# Patient Record
Sex: Male | Born: 2000 | Hispanic: Yes | Marital: Single
Health system: Midwestern US, Community
[De-identification: ages and names within clinical notes are randomized; demographics above are authoritative.]

## PROBLEM LIST (undated history)

## (undated) DIAGNOSIS — J45909 Unspecified asthma, uncomplicated: Secondary | ICD-10-CM

## (undated) DIAGNOSIS — K469 Unspecified abdominal hernia without obstruction or gangrene: Secondary | ICD-10-CM

## (undated) DIAGNOSIS — L0591 Pilonidal cyst without abscess: Secondary | ICD-10-CM

## (undated) DIAGNOSIS — M2550 Pain in unspecified joint: Secondary | ICD-10-CM

## (undated) DIAGNOSIS — M25561 Pain in right knee: Principal | ICD-10-CM

## (undated) HISTORY — DX: Unspecified asthma, uncomplicated: J45.909

## (undated) HISTORY — DX: Unspecified abdominal hernia without obstruction or gangrene: K46.9

---
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
## 2022-11-06 ENCOUNTER — Ambulatory Visit
Admit: 2022-11-06 | Discharge: 2022-11-06 | Payer: PRIVATE HEALTH INSURANCE | Attending: Family Medicine | Primary: Family Medicine

## 2022-11-06 DIAGNOSIS — M545 Low back pain, unspecified: Secondary | ICD-10-CM

## 2022-11-06 NOTE — Progress Notes (Signed)
Subjective  Chief Complaint   Patient presents with    New Patient     Establish care, has been having aches and pain lately.      HPI:  Derek Dudley is a 22 y.o. male.    He presents with a few musculoskeletal complaints.    Right wrist-reports that he couple weeks of increased pain based on position.  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    Left shoulder pain-reports uncomfortable clicking based on certain movements.  This began after a dislocation while swimming a bat during a baseball game.  The dislocation was quick and corrected itself.    Lower back pain-reports many years of lower back pain, exacerbated by bending over.  Patient has active lifestyle and this gets his way at times.  Is a physical therapist friend who suggested doing strengthening exercises such as [place] dead lifts.  He reports he has been doing this for couple months with a little bit of improvement.    Past Medical History:   Diagnosis Date    ADHD (attention deficit hyperactivity disorder)     Chronic back pain      No current outpatient medications on file prior to visit.     No current facility-administered medications on file prior to visit.     No Known Allergies    Objective  Vitals:    11/06/22 1108   BP: 128/65   Pulse: 56   Resp: 18   Temp: 98.1 F (36.7 C)     Wt Readings from Last 3 Encounters:   11/06/22 78.5 kg (173 lb)     Physical Exam  Constitutional:       Appearance: Normal appearance.   HENT:      Head: Normocephalic and atraumatic.   Cardiovascular:      Rate and Rhythm: Normal rate and regular rhythm.   Pulmonary:      Effort: Pulmonary effort is normal.      Breath sounds: Normal breath sounds.   Musculoskeletal:        Arms:       Comments: Left wrist-pain with gripping on ulnar side of wrist.  lower back pain-midline-mild discomfort with palpitation.  Pain reproduced with bending to toes.     Neurological:      Mental Status:  He is alert.          Assessment & Plan  1. Chronic midline low back pain without sciatica  Assessment & Plan:   X-ray done by Ortho in 2023 did not demonstrate any fracture or other abnormality.  Likely muscular or tendon/ligament based.  Patient has active lifestyle and uses his back a lot.  He has started dead lifting to increase his strength and reports a little improvement over the last 2 months.  Advised him to continue.  If he is worsening or not improving, we could obtain MRI to look for soft tissue damage.  2. Chronic left shoulder pain  Assessment & Plan:   Secondary to dislocation in the distant past.  Possibly shoulder instability due to stretch tendons or damaged labrum.  Discussed continuing light exercises.  If symptoms worsening can proceed with x-ray followed by MRI.  3. Strain of right wrist, initial encounter  Comments:  Likely due to weightlifting as well as starting new job as a Scientist, forensic.  Discussed strategies to rest wrist with lifting straps  34 minutes total spent on visit.    Aspects of this note have been generated using voice recognition software. Despite editing, there may be some syntax errors  Follow-up and Dispositions    Return in about 6 weeks (around 12/18/2022).       Roma Schanz, MD

## 2022-11-06 NOTE — Progress Notes (Signed)
"  Have you been to the ER, urgent care clinic since your last visit?  Hospitalized since your last visit?"    NO    "Have you seen or consulted any other health care providers outside of [facility] since your last visit?"    NO            Click Here for Release of Records Request

## 2022-11-06 NOTE — Assessment & Plan Note (Signed)
Secondary to dislocation in the distant past.  Possibly shoulder instability due to stretch tendons or damaged labrum.  Discussed continuing light exercises.  If symptoms worsening can proceed with x-ray followed by MRI.

## 2022-11-06 NOTE — Assessment & Plan Note (Signed)
X-ray done by Ortho in 2023 did not demonstrate any fracture or other abnormality.  Likely muscular or tendon/ligament based.  Patient has active lifestyle and uses his back a lot.  He has started dead lifting to increase his strength and reports a little improvement over the last 2 months.  Advised him to continue.  If he is worsening or not improving, we could obtain MRI to look for soft tissue damage.

## 2022-12-18 ENCOUNTER — Ambulatory Visit
Admit: 2022-12-18 | Discharge: 2022-12-18 | Payer: PRIVATE HEALTH INSURANCE | Attending: Family Medicine | Primary: Family Medicine

## 2022-12-18 VITALS — BP 126/73 | HR 51 | Temp 98.30000°F | Ht 68.0 in | Wt 170.8 lb

## 2022-12-18 DIAGNOSIS — M25512 Pain in left shoulder: Secondary | ICD-10-CM

## 2022-12-18 NOTE — Progress Notes (Signed)
 Subjective  Chief Complaint   Patient presents with    Other     Follow  up health concerns     HPI:  Derek Dudley is a 22 y.o. male.    Patient presents for follow-up for multiple musculoskeletal complaints.    Left foot-reports right MTP has been hurting him over the last several weeks.  He has had some improvement with switching to great arch support, but he is not consistent with wearing the shoes.    Left hip-reports pain with external rotation while flexed.  Symptoms have been constant over the last 2 months with no improvement.    Left shoulder pain-patient has not had much improvement with exercising on his own, and reports left shoulder is weaker and easier to fatigue.    Right shoulder pain-for the last 2 weeks.  Patient reports it feels similar to tendinopathy in the past.  Symptoms have only worsened when he is try to work out on his own.    Past Medical History:   Diagnosis Date    ADHD (attention deficit hyperactivity disorder)     Chronic back pain      No current outpatient medications on file prior to visit.     No current facility-administered medications on file prior to visit.     No Known Allergies    Objective  Vitals:    12/18/22 1053   BP: 126/73   Pulse: 51   Temp: 98.3 F (36.8 C)     Wt Readings from Last 3 Encounters:   12/18/22 77.5 kg (170 lb 12.8 oz)   11/06/22 78.5 kg (173 lb)     Physical Exam  Constitutional:       Appearance: Normal appearance.   HENT:      Head: Normocephalic and atraumatic.   Cardiovascular:      Rate and Rhythm: Normal rate and regular rhythm.   Pulmonary:      Effort: Pulmonary effort is normal.      Breath sounds: Normal breath sounds.   Neurological:      General: No focal deficit present.      Mental Status: He is alert and oriented to person, place, and time.   Psychiatric:         Mood and Affect: Mood normal.         Behavior: Behavior normal.          Assessment & Plan  1. Chronic left shoulder pain  Assessment & Plan:   Secondary to dislocation in the  distant past. Possibly shoulder instability due to stretch tendons or damaged labrum.  Symptoms not improving much with exercises on his own.  Will refer to PT for further education and treatment  2. Left hip pain  Assessment & Plan:   Possible strain versus sprain versus hip joint inflammation. Will start with PT to see if he can be treated with strength and flexibility training   3. Acute pain of right shoulder  Assessment & Plan:   Appears to be tendinitis. Will refer to PT to discuss how to restrengthen the shoulder without aggravating   4. Left foot pain  Comments:  Tender to right first MTP.  Will treat with Voltaren .  Advised to continue wearing better arch support    30 minutes spent on encounter.    Aspects of this note have been generated using voice recognition software. Despite editing, there may be some syntax errors  Follow-up and Dispositions    Return in about  1 year (around 12/18/2023).       Lonni Pinal, MD

## 2022-12-18 NOTE — Progress Notes (Signed)
 Derek Dudley (DOB:  2000-08-16) is a 22 y.o. male,Established patient, here for evaluation of the following chief complaint(s):  Other (Follow  up health concerns)         Assessment & Plan    R wrist- pt stated that his right wrist was better and no further workup was needed    L shoulder- crepitus was noted upon abduction; full can test and drop arm test were both negative bilaterally; physical therapy referral was noted; pt was notified to take precaution in exercising and working with shoulder    Pain and tightness on R base of foot- pt notified to use voltaren  as needed and using different shoes    Pinched nerve extending from left cervical spine to left mid thoracic area- pt was referred for physical therapy    No follow-ups on file.       Subjective   HPI    R wrist- pt admits that it has been getting better    L shoulder- pt admits to having crepitus whenever he moves his shoulder; pt denies pain    Pain/tightness on R base of foot- pt doesn't remember the onset but has been getting worse for the past few months; pt denies the pain radiating anywhere; pt denies tingling/numbness; pt admits to wearing tight shoes; pt's pain is centered around great toe    Pinched nerve in the back- pt stated that his left side feels numb and tingling towards the thoracic area; pt does not remember a triggering event    Review of Systems       Objective   Physical Exam  Vitals reviewed.   Constitutional:       Appearance: Normal appearance.   Musculoskeletal:         General: Tenderness present. Normal range of motion.   Neurological:      General: No focal deficit present.      Mental Status: He is alert and oriented to person, place, and time.                  An electronic signature was used to authenticate this note.    --Annabella Donalds

## 2022-12-18 NOTE — Assessment & Plan Note (Signed)
 Secondary to dislocation in the distant past. Possibly shoulder instability due to stretch tendons or damaged labrum.  Symptoms not improving much with exercises on his own.  Will refer to PT for further education and treatment

## 2022-12-18 NOTE — Assessment & Plan Note (Signed)
 Possible strain versus sprain versus hip joint inflammation. Will start with PT to see if he can be treated with strength and flexibility training

## 2022-12-18 NOTE — Progress Notes (Signed)
"  Have you been to the ER, urgent care clinic since your last visit?  Hospitalized since your last visit?"    NO    "Have you seen or consulted any other health care providers outside of [facility] since your last visit?"    NO

## 2022-12-18 NOTE — Assessment & Plan Note (Signed)
 Appears to be tendinitis. Will refer to PT to discuss how to restrengthen the shoulder without aggravating

## 2023-01-05 ENCOUNTER — Inpatient Hospital Stay: Admit: 2023-01-05 | Payer: PRIVATE HEALTH INSURANCE | Attending: Family Medicine | Primary: Family Medicine

## 2023-01-05 DIAGNOSIS — M25512 Pain in left shoulder: Secondary | ICD-10-CM

## 2023-01-05 NOTE — Therapy Evaluation (Signed)
 Physical Therapy at [facility],   a part of [facility]  [address] [place], [address]  [place], [place]  [address]  Phone: [phone]  Fax: [phone]       PHYSICAL THERAPY - EVALUATION/PLAN OF CARE NOTE (updated 3/23)      Date: 01/05/2023          Patient Name:  Derek Dudley DOB:  Sep 02, 2000   Medical   Diagnosis:  Chronic left shoulder pain [M25.512, G89.29]  Left hip pain [M25.552]  Acute pain of right shoulder [M25.511]  Left foot pain [M79.672] Treatment Diagnosis:  M25.552  LEFT HIP PAIN  and M25.511  RIGHT SHOULDER PAIN    Referral Source:  Cleotilde Bruckner, MD Provider #:  [id number]                Insurance: Payor: [facility] / Plan: [facility] / Product Type: *No Product type* /      Patient DOB verified yes     Visit #   Current  / Total 1 8   Time   In / Out 1230p 115p   Total Treatment Time 45   Total Timed Codes 0         SUBJECTIVE  Pain Level (0-10 scale): 2  [] constant [x] intermittent [] improving [] worsening [] no change since onset    Any medication changes, allergies to medications, adverse drug reactions, diagnosis change, or new procedure performed?: [x]  No    []  Yes (see summary sheet for update)  Medications: Verified on Patient Summary List    Subjective functional status/changes:     Pt reports his left hip started bothering him 3 months ago with squatting.  He notes his right shoulder has been painful a year and a half.  It went away for a couple months and he got a more physically demanding job at [employer] where he is moving people around a bit.  He reports lifting his arm in front of him and reaching overhead make his pain worse.  He notes straight on chest press is fine, but overhead presses hurt.  He notes biofreeze helps, but rest is most helpful.  He notes hip pain increased with more depth during a squat or getting up/down off the floor.    Start of Care: 01/05/2023  Onset Date: 09/19/2021  Current  symptoms/Complaints: Pt reports R shoulder tightness/ache and L hip anterior pinch/pain with hip flexion.  Mechanism of Injury: overuse  PLOF: Independent  Limitations to PLOF/Activity or Recreational Limitations: pain at work, unable to exercise at gym without pain  Work Hx: started new job at [employer] Situation: lives at home with parents  Mobility: Independent  Self Care: Independnent  Previous Treatment/Compliance: poor, [facility] that he did not enjoy  PMHx/Surgical Hx/Comorbidites: R ACL reconstructive surgery  Prior Hospitalization:  none reported  Barriers: [x] pain [] Financial [x] time [] transportation [] Other:  Substance use: [] Alcohol [] Tobacco [] other:   Pt Goals: pain free return to work and exercise  Motivation: High  Cognition: A & O x 4           OBJECTIVE  Posture:  WNL  Palpation: TTP @ R long head of bicep and supraspinatus tendon, L hip flexor    Shoulder ROM:   AROM   PROM   Flexion   WNL   WNL   Abduction  WNL   WNL   Scaption  WNL   WNL  Apley - IR/ADD WNL   WNL   Apley - ER/ABD WNL   WNL  Hip ROM:   Flexion   75% pain at end range on L    Joint Mobility Assessment: Glenohumeral: 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      Scapulohumeral: WNL bilaterally      Sternoclavicular: WNL bilaterally    Flexibility: limited hamstring length bilaterally    UPPER QUARTER   MUSCLE STRENGTH  KEY       R  L  0 - No Contraction   Flexion  4  4  1  - Trace    Extension 4  5  2  - Poor    Abduction 3+  4  3 - Fair     IR  4  5  4  - Good    ER  4  4+  5 - Normal       Neurological: Reflexes / Sensations: WNL bilaterally  Special Tests: Painful Arc Sign: + on R     Scapular Reposition: + on R    Bicep's Load II: - bilaterally   Speed's: + on R     Objective/Functional Outcome Measure: DASH  FOTO Score: 50  FOTO score = an established functional score where 100 = no disability      45 min [x] Eval - untimed                            [x]   Patient Education billed concurrently with other procedures   [x]   Review HEP    []  Progressed/Changed HEP, detail:    [x]  Other detail: discussion of modifying exercise routine    Pain Level at end of session (0-10 scale): 1    Plan of Care / Statement of Necessity for Physical Therapy Services     Assessment / key information:     Mr. Terlizzi is referred to skilled therapy services for treatment of right shoulder and left hip pain.  He notes pain off/on with exercise, but continued increase in symptoms recently with new job at [employer].  He reports difficulty with sleeping and exercise as well as pain during physical work duties such as pushing and pulling.  He will benefit from skilled therapy services to reduce his pain with return to ADL, work and exercise.    Evaluation Complexity:  History:  LOW Complexity : Zero comorbidities / personal factors that will impact the outcome / POC; Examination:  LOW Complexity : 1-2 Standardized tests and measures addressing body structure, function, activity limitation and / or participation in recreation  ;Presentation:  LOW Complexity : Stable, uncomplicated  ;Clinical Decision Making:  MEDIUM Complexity : FOTO score of 26-74 Overall Complexity Rating: LOW   Problem List: pain affecting function, decrease ROM, decrease strength, decrease ADL/functional abilities, decrease activity tolerance, and decrease flexibility/joint mobility   Treatment Plan may include any combination of the following: 02889 Therapeutic Exercise, 97112 Neuromuscular Re-Education, 97140 Manual Therapy, 97530 Therapeutic Activity, 97535 Self Care/Home Management, and (Elective Self Pay) Needle Insertion w/o Injection (1 or 2 muscles), (3+ muscles)  Patient / Family readiness to learn indicated by: asking questions, trying to perform skills, interest, return verbalization , and return demonstration   Persons(s) to be included in education: patient (P)  Barriers to Learning/Limitations: none  Measures taken if barriers to learning present: none  Patient Self Reported  Health Status: excellent  Rehabilitation Potential: excellent    Short Term Goals: To be accomplished in  4 treatments.  The patient will be independent with introductory HEP with no v.c. or tactile cuing by PT needed   The patient will improve pain-free shoulder flexion AROM to 180 deg to improve ease with overhead reaching tasks at work.  The patient will demonstrate ability to squat to/from below chair height and get up/down off the floor as required by work with < 2/10 hip pain.  Long Term Goals: To be accomplished in 8 treatments.  The patient will demonstrate pain free shoulder AROM multidirectionally to improve ease with overhead work tasks.  The patient will report ability to unload groceries from car into home without an increase in pain within the last 5 days  The patient will demonstrate a minimum of 5/5 strength of shoulder abduction and flexion to allow for UE endurance for fulling loading and transferring clothing from washer and dryer without rest breaks and without an increase in pain.  The patient will demonstrate ability to lift 2 pounds overhead with pain level of 2/10 or less to improve ease of putting away dishes in kitchen and work activities.    Frequency / Duration: Patient to be seen 1 times per week for 8 treatments.    Patient/ Caregiver education and instruction: Diagnosis, prognosis, self care, activity modification, and exercises   [x]   Plan of care has been reviewed with PTA          Prentice Sous, PT , DPT, Cert. DN, CRGA Level II       01/05/2023       12:29 PM        ===================================================================  I certify that the above Therapy Services are being furnished while the patient is under my care. I agree with the treatment plan and certify that this therapy is necessary.    Physician's Signature:_________________________   DATE:_________   TIME:________                           Cleotilde Bruckner, MD    ** Signature, Date and Time must be completed  for valid certification **  Please sign and fax to [phone].  Thank you

## 2023-01-12 ENCOUNTER — Encounter: Payer: PRIVATE HEALTH INSURANCE | Primary: Family Medicine

## 2023-01-19 ENCOUNTER — Inpatient Hospital Stay: Admit: 2023-01-19 | Payer: PRIVATE HEALTH INSURANCE | Primary: Family Medicine

## 2023-01-19 NOTE — Progress Notes (Signed)
 PHYSICAL THERAPY - DAILY TREATMENT NOTE (updated 3/23)      Date: 01/19/2023          Patient Name:  Derek Dudley DOB:  [birth year]   Medical   Diagnosis:  Chronic left shoulder pain [M25.512, G89.29]  Left hip pain [M25.552]  Acute pain of right shoulder [M25.511]  Left foot pain [M79.672] Treatment Diagnosis:  M25.552  LEFT HIP PAIN  and M25.511  RIGHT SHOULDER PAIN    Referral Source:  Cleotilde Bruckner, MD Insurance:   Payor: [facility] / Plan: [facility] / Product Type: *No Product type* /                     Patient DOB verified yes     Visit #   Current  / Total 2 8   Time   In / Out 1200p 1225p   Total Treatment Time 25   Total Timed Codes 25         SUBJECTIVE    Pain Level (0-10 scale): 2    Any medication changes, allergies to medications, adverse drug reactions, diagnosis change, or new procedure performed?: [x]  No    []  Yes (see summary sheet for update)  Medications: Verified on Patient Summary List    Subjective functional status/changes:       Pt reports the exercises are helping.  He has limited his squat depth and used the hip flexor mobility with success.  He notes a little tightness and pain in the shoulder, but he is doing his home exercises every other day.    OBJECTIVE      Therapeutic Procedures:  Tx Min Billable or 1:1 Min (if diff from Tx Min) Procedure, Rationale, Specifics   25  97110 Therapeutic Exercise (timed):  increase ROM, strength, coordination, balance, and proprioception to improve patient's ability to progress to PLOF and address remaining functional goals. (see flow sheet as applicable)     Details if applicable:           Details if applicable:     25 25    Total Total       [x]   Patient Education billed concurrently with other procedures   [x]  Review HEP    []  Progressed/Changed HEP, detail:    []  Other detail:         Other Objective/Functional Measures  Squat depth below 90 degrees with BW without pain.  Pain noted with addition of 20 lbs on  this date.    Pain Level at end of session (0-10 scale): 1      Assessment     Patient tolerated progression of squat depth and scapular stability exercises well on this date.  He reports performing them every other day at home and is surprised by how difficulty they can be.  He is motivated to continue as he sees improving symptoms with his daily activities.    Patient will continue to benefit from skilled PT / OT services to modify and progress therapeutic interventions, analyze and address functional mobility deficits, analyze and address ROM deficits, analyze and address strength deficits, analyze and address soft tissue restrictions, and analyze and cue for proper movement patterns to address functional deficits and attain remaining goals.    Progress toward goals / Updated goals:  []   See Progress Note/Recertification    Short Term Goals: To be accomplished in 4 treatments.  The patient will be independent with introductory HEP with no  v.c. or tactile cuing by PT needed   The patient will improve pain-free shoulder flexion AROM to 180 deg to improve ease with overhead reaching tasks at work.  The patient will demonstrate ability to squat to/from below chair height and get up/down off the floor as required by work with < 2/10 hip pain.  Long Term Goals: To be accomplished in 8 treatments.  The patient will demonstrate pain free shoulder AROM multidirectionally to improve ease with overhead work tasks.  The patient will report ability to unload groceries from car into home without an increase in pain within the last 5 days  The patient will demonstrate a minimum of 5/5 strength of shoulder abduction and flexion to allow for UE endurance for fulling loading and transferring clothing from washer and dryer without rest breaks and without an increase in pain.  The patient will demonstrate ability to lift 2 pounds overhead with pain level of 2/10 or less to improve ease of putting away dishes in kitchen and work  activities.      PLAN  Yes  Continue plan of care  [x]   Upgrade activities as tolerated  []   Discharge due to:  []   Other:      Prentice Sous, PT , DPT, Cert. DN, CRGA Level II       01/19/2023       12:04 PM

## 2023-01-26 ENCOUNTER — Inpatient Hospital Stay: Admit: 2023-01-26 | Payer: PRIVATE HEALTH INSURANCE | Primary: Family Medicine

## 2023-01-26 DIAGNOSIS — M25512 Pain in left shoulder: Secondary | ICD-10-CM

## 2023-01-26 NOTE — Progress Notes (Signed)
PHYSICAL THERAPY - DAILY TREATMENT NOTE (updated 3/23)      Date: 01/26/2023          Patient Name:  Derek Dudley DOB:  Mar 29, 2001   Medical   Diagnosis:  Chronic left shoulder pain [M25.512, G89.29]  Left hip pain [M25.552]  Acute pain of right shoulder [M25.511]  Left foot pain [M79.672] Treatment Diagnosis:  M25.552  LEFT HIP PAIN  and M25.511  RIGHT SHOULDER PAIN    Referral Source:  Roma Schanz, MD Insurance:   Payor: [employer] [facility] / Plan: [facility] / Product Type: *No Product type* /                     Patient DOB verified yes     Visit #   Current  / Total 3 8   Time   In / Out 1150a 1230p   Total Treatment Time 40   Total Timed Codes 40         SUBJECTIVE    Pain Level (0-10 scale): 1    Any medication changes, allergies to medications, adverse drug reactions, diagnosis change, or new procedure performed?: [x]  No    []  Yes (see summary sheet for update)  Medications: Verified on Patient Summary List    Subjective functional status/changes:       Pt reports the hips and shoulder are feeling better with exercises.  He did box squats after PT last time and was able to increase with weight without pain.    OBJECTIVE      Therapeutic Procedures:  Tx Min Billable or 1:1 Min (if diff from Tx Min) Procedure, Rationale, Specifics   25  97110 Therapeutic Exercise (timed):  increase ROM, strength, coordination, balance, and proprioception to improve patient's ability to progress to PLOF and address remaining functional goals. (see flow sheet as applicable)     Details if applicable:     15  97112 Neuromuscular Re-Education (timed):  improve balance, coordination, kinesthetic sense, posture, core stability and proprioception to improve patient's ability to develop conscious control of individual muscles and awareness of position of extremities in order to progress to PLOF and address remaining functional goals. (see flow sheet as applicable)    Details if applicable:     25 25     Total Total       [x]   Patient Education billed concurrently with other procedures   [x]  Review HEP    []  Progressed/Changed HEP, detail:    []  Other detail:         Other Objective/Functional Measures  Squat depth below 90 degrees with 20lbs without pain.  Pain noted with increase in squat depth.    Shoulder pain reported after overhead carry with 10 lb kettlebell bottoms up    Pain Level at end of session (0-10 scale): 1      Assessment     Patient tolerated progression of squat depth and scapular stability exercises well on this date.  He reports performing them every other day at home.  He was given more hip mobility exercises to focus on at home as he needs deeper squat for work activities and is unable to pain free currently.    Patient will continue to benefit from skilled PT / OT services to modify and progress therapeutic interventions, analyze and address functional mobility deficits, analyze and address ROM deficits, analyze and address strength deficits, analyze and address soft tissue restrictions, and analyze and cue  for proper movement patterns to address functional deficits and attain remaining goals.    Progress toward goals / Updated goals:  []   See Progress Note/Recertification    Short Term Goals: To be accomplished in 4 treatments.  The patient will be independent with introductory HEP with no v.c. or tactile cuing by PT needed   The patient will improve pain-free shoulder flexion AROM to 180 deg to improve ease with overhead reaching tasks at work.  The patient will demonstrate ability to squat to/from below chair height and get up/down off the floor as required by work with < 2/10 hip pain.  Long Term Goals: To be accomplished in 8 treatments.  The patient will demonstrate pain free shoulder AROM multidirectionally to improve ease with overhead work tasks.  The patient will report ability to unload groceries from car into home without an increase in pain within the last 5 days  The patient  will demonstrate a minimum of 5/5 strength of shoulder abduction and flexion to allow for UE endurance for fulling loading and transferring clothing from washer and dryer without rest breaks and without an increase in pain.  The patient will demonstrate ability to lift 2 pounds overhead with pain level of 2/10 or less to improve ease of putting away dishes in kitchen and work activities.      PLAN  Yes  Continue plan of care  [x]   Upgrade activities as tolerated  []   Discharge due to:  []   Other:      Dyann Ruddle, PT , DPT, Cert. DN, CRGA Level II       01/26/2023       11:52 AM

## 2023-02-02 ENCOUNTER — Inpatient Hospital Stay: Admit: 2023-02-02 | Payer: PRIVATE HEALTH INSURANCE | Primary: Family Medicine

## 2023-02-02 NOTE — Progress Notes (Signed)
PHYSICAL THERAPY - DAILY TREATMENT NOTE (updated 3/23)      Date: 02/02/2023          Patient Name:  Derek Dudley DOB:  2001/04/06   Medical   Diagnosis:  Chronic left shoulder pain [M25.512, G89.29]  Left hip pain [M25.552]  Acute pain of right shoulder [M25.511]  Left foot pain [M79.672] Treatment Diagnosis:  M25.552  LEFT HIP PAIN  and M25.511  RIGHT SHOULDER PAIN    Referral Source:  Roma Schanz, MD Insurance:   Payor: [employer] [facility] [facility] [facility] / Plan: [facility] / Product Type: *No Product type* /                     Patient DOB verified yes     Visit #   Current  / Total 3 8   Time   In / Out 1150a 1230p   Total Treatment Time 40   Total Timed Codes 40         SUBJECTIVE    Pain Level (0-10 scale): 2    Any medication changes, allergies to medications, adverse drug reactions, diagnosis change, or new procedure performed?: [x]  No    []  Yes (see summary sheet for update)  Medications: Verified on Patient Summary List    Subjective functional status/changes:       Pt reports the last 2 weeks the inside of his R hamstring up to his glute has been bothering him.  He notes the heel started to bother him recently too.  He reports the left hip and right shoulder are feeling better.    OBJECTIVE      Therapeutic Procedures:  Tx Min Billable or 1:1 Min (if diff from Tx Min) Procedure, Rationale, Specifics   25  97110 Therapeutic Exercise (timed):  increase ROM, strength, coordination, balance, and proprioception to improve patient's ability to progress to PLOF and address remaining functional goals. (see flow sheet as applicable)     Details if applicable:     15  97112 Neuromuscular Re-Education (timed):  improve balance, coordination, kinesthetic sense, posture, core stability and proprioception to improve patient's ability to develop conscious control of individual muscles and awareness of position of extremities in order to progress to PLOF and address remaining functional goals. (see flow  sheet as applicable)    Details if applicable:     25 25    Total Total       [x]   Patient Education billed concurrently with other procedures   [x]  Review HEP    []  Progressed/Changed HEP, detail:    []  Other detail:         Other Objective/Functional Measures  Squat depth below 90 degrees with 40lbs with L hamstring pain.  Pain noted with increase in squat depth.    R medial distal hamstring pain with palpation and resisted knee flexion    Shoulder pain reported after overhead carry with 15 lb kettlebell bottoms up    Pain Level at end of session (0-10 scale): 1      Assessment     Patient tolerated introduction to hamstring specific exercises on this date with emphasis on mobility routine.  He notes less pain at depth of squat with sit to stand and no pain in his shoulder with return to upper body exercises at gym that are not overhead.  He will continue to benefit from exercise progression as tolerated with education on avoiding running for now while his hamstring and  hip recover.    Patient will continue to benefit from skilled PT / OT services to modify and progress therapeutic interventions, analyze and address functional mobility deficits, analyze and address ROM deficits, analyze and address strength deficits, analyze and address soft tissue restrictions, and analyze and cue for proper movement patterns to address functional deficits and attain remaining goals.    Progress toward goals / Updated goals:  []   See Progress Note/Recertification    Short Term Goals: To be accomplished in 4 treatments.  The patient will be independent with introductory HEP with no v.c. or tactile cuing by PT needed   The patient will improve pain-free shoulder flexion AROM to 180 deg to improve ease with overhead reaching tasks at work.  The patient will demonstrate ability to squat to/from below chair height and get up/down off the floor as required by work with < 2/10 hip pain.  Long Term Goals: To be accomplished in 8  treatments.  The patient will demonstrate pain free shoulder AROM multidirectionally to improve ease with overhead work tasks.  The patient will report ability to unload groceries from car into home without an increase in pain within the last 5 days  The patient will demonstrate a minimum of 5/5 strength of shoulder abduction and flexion to allow for UE endurance for fulling loading and transferring clothing from washer and dryer without rest breaks and without an increase in pain.  The patient will demonstrate ability to lift 2 pounds overhead with pain level of 2/10 or less to improve ease of putting away dishes in kitchen and work activities.      PLAN  Yes  Continue plan of care  [x]   Upgrade activities as tolerated  []   Discharge due to:  []   Other:      Dyann Ruddle, PT , DPT, Cert. DN, CRGA Level II       02/02/2023       11:51 AM

## 2023-02-02 NOTE — Discharge Instructions (Signed)
Physical Therapy at [facility],   a part of [facility]  [address] [place], [address]  [place], [place] [address]  Phone: [phone]  Fax: [phone]  DISCHARGE SUMMARY  Patient Name: Derek Dudley DOB: March 17, 2001   Treatment/Medical Diagnosis: Chronic left shoulder pain [M25.512, G89.29]  Left hip pain [M25.552]  Acute pain of right shoulder [M25.511]  Left foot pain [M79.672]   Referral Source: Roma Schanz, MD     Date of Initial Visit: 01/05/2023 Attended Visits: 4 Missed Visits: 1     SUMMARY OF TREATMENT  [name] cancelled remaining PT visits as he reports he no longer has hip or shoulder pain with daily activities.  He met all goals (per pt report) except for 1 that was not able to be assessed as he decided to forego his last PT visit.  He reports independence with home exercises and no further pain with return to gym and work activities.    CURRENT STATUS    Short Term Goals: To be accomplished in 4 treatments.  The patient will be independent with introductory HEP with no v.c. or tactile cuing by PT needed . MET  The patient will improve pain-free shoulder flexion AROM to 180 deg to improve ease with overhead reaching tasks at work. MET  The patient will demonstrate ability to squat to/from below chair height and get up/down off the floor as required by work with < 2/10 hip pain. MET  Long Term Goals: To be accomplished in 8 treatments.  The patient will demonstrate pain free shoulder AROM multidirectionally to improve ease with overhead work tasks. MET  The patient will report ability to unload groceries from car into home without an increase in pain within the last 5 days. MET  The patient will demonstrate a minimum of 5/5 strength of shoulder abduction and flexion to allow for UE endurance for fulling loading and transferring clothing from washer and dryer without rest breaks and without an increase in pain. NOT ASSESSED  The patient will demonstrate  ability to lift 2 pounds overhead with pain level of 2/10 or less to improve ease of putting away dishes in kitchen and work activities. MET      RECOMMENDATIONS  Discontinue therapy. Progressing towards or have reached established goals.        Dyann Ruddle, PT , DPT, Cert. DN, CRGA Level II        02/23/2023       2:01 PM    If you have any questions/comments please contact us directly at [phone].   Thank you for allowing [place] to assist in the care of your patient.

## 2023-02-23 ENCOUNTER — Encounter: Payer: PRIVATE HEALTH INSURANCE | Primary: Family Medicine

## 2023-03-13 ENCOUNTER — Encounter: Payer: PRIVATE HEALTH INSURANCE | Primary: Family Medicine

## 2023-03-14 ENCOUNTER — Inpatient Hospital Stay
Admit: 2023-03-14 | Discharge: 2023-03-14 | Disposition: A | Discharge status: Home / Self Care | Payer: PRIVATE HEALTH INSURANCE | Attending: Emergency Medicine

## 2023-03-14 ENCOUNTER — Ambulatory Visit: Admit: 2023-03-14 | Payer: PRIVATE HEALTH INSURANCE | Primary: Family Medicine

## 2023-03-14 DIAGNOSIS — L0501 Pilonidal cyst with abscess: Secondary | ICD-10-CM

## 2023-03-14 MED ORDER — IBUPROFEN 600 MG PO TABS
600 | ORAL | Status: AC
Start: 2023-03-14 — End: 2023-03-14
  Administered 2023-03-14: 19:00:00 600 mg via ORAL

## 2023-03-14 MED ORDER — LIDOCAINE-EPINEPHRINE (PF) 1.5 %-1:200000 IJ SOLN
1.5 | Freq: Once | INTRAMUSCULAR | Status: AC
Start: 2023-03-14 — End: 2023-03-14
  Administered 2023-03-14: 22:00:00 5 mL via INTRADERMAL

## 2023-03-14 MED ORDER — OXYCODONE HCL 5 MG PO TABS
5 | ORAL | Status: AC
Start: 2023-03-14 — End: 2023-03-14
  Administered 2023-03-14: 21:00:00 5 mg via ORAL

## 2023-03-14 MED FILL — OXYCODONE HCL 5 MG PO TABS: 5 MG | ORAL | Qty: 1 | Fill #0

## 2023-03-14 MED FILL — IBUPROFEN 600 MG PO TABS: 600 MG | ORAL | Qty: 1 | Fill #0

## 2023-03-14 MED FILL — LIDOCAINE-EPINEPHRINE (PF) 1.5 %-1:200000 IJ SOLN: 1.5 %-1:200000 | INTRAMUSCULAR | Qty: 30 | Fill #0

## 2023-03-14 NOTE — Discharge Instructions (Addendum)
 Today you were evaluated for an abscess. Please keep the area clean and dry. You may soak 1-2x a day in an epsom bath to encourage continued drainage. You may alternate tylenol and motrin and place an ice pack to the area for pain. Please follow up with th

## 2023-03-14 NOTE — ED Triage Notes (Signed)
 Pt arrives to the ED with a tailbone cyst, pt reports he noticed it on Sunday and said there was some clear odorous drainage coming from it with a tinge of blood, pt reports its been getting more painful throughout the week. Denies fevers, reports some nau

## 2023-03-14 NOTE — ED Provider Notes (Signed)
 [facility] EMERGENCY DEPT  EMERGENCY DEPARTMENT ENCOUNTER      Pt Name: Derek Dudley  MRN: [id number]  Birthdate [birth year]  Date of evaluation: 03/14/2023  Provider: Debby Freiberg, PA-C    CHIEF COMPLAINT     No chief complaint on file.        HISTORY OF

## 2023-03-25 ENCOUNTER — Ambulatory Visit: Admit: 2023-03-25 | Payer: PRIVATE HEALTH INSURANCE | Admitting: Surgery | Primary: Family Medicine

## 2023-03-25 VITALS — BP 125/82 | HR 73 | Temp 97.90000°F | Resp 16 | Ht 66.0 in | Wt 184.2 lb

## 2023-03-25 DIAGNOSIS — L0501 Pilonidal cyst with abscess: Principal | ICD-10-CM

## 2023-03-25 NOTE — Progress Notes (Signed)
 Surgery Progress Note    03/25/2023    had concerns including New Patient, Abscess (Pilonidal), and ED Follow-up.     Subjective:     Patient is a 22 y.o. male who reports that last month developed an abscess by buttock cleft area for the first time.  Denie

## 2023-03-25 NOTE — Progress Notes (Signed)
 Identified pt with two pt identifiers (name and DOB). Reviewed chart in preparation for visit and have obtained necessary documentation.    Derek Dudley is a 22 y.o. male  Chief Complaint   Patient presents with    New Patient    Abscess     Pilonidal

## 2023-04-08 ENCOUNTER — Ambulatory Visit
Admit: 2023-04-08 | Discharge: 2023-04-08 | Payer: PRIVATE HEALTH INSURANCE | Attending: Surgery | Admitting: Surgery | Primary: Family Medicine

## 2023-04-08 VITALS — BP 120/77 | HR 62 | Temp 98.20000°F | Resp 14 | Ht 66.0 in | Wt 186.2 lb

## 2023-04-08 DIAGNOSIS — L0501 Pilonidal cyst with abscess: Principal | ICD-10-CM

## 2023-04-08 NOTE — Progress Notes (Signed)
 Identified pt with two pt identifiers (name and DOB). Reviewed chart in preparation for visit and have obtained necessary documentation.    Derek Dudley is a 22 y.o. male  Chief Complaint   Patient presents with    Abscess     Pilonidal     BP 120/77 (Sit

## 2023-04-08 NOTE — Progress Notes (Signed)
 Surgery Progress Note    04/08/2023    had concerns including Abscess (Pilonidal).     Subjective:     Patient is a 22 y.o. male who I saw earlier this month for a pilonidal cyst.  He had an incision and drainage on 1120 05/2022 by the ED.  When I saw him i

## 2023-04-08 NOTE — Telephone Encounter (Signed)
 Called and spoke to pt, confirmed surgery with Dr. Lauretta Grill on 04/10/23. Notified pt of arrival time and briefly went over pre-op instructions. Will also send surgical letter in the mail for post op appt and upload to MyChart.

## 2023-04-09 NOTE — Other (Signed)
 [facility]                   [address] [place], [place] [address]   MAIN OR                                  [phone]   MAIN PRE OP           [phone]

## 2023-04-10 ENCOUNTER — Inpatient Hospital Stay
Admit: 2023-04-10 | Discharge: 2023-04-10 | Disposition: A | Discharge status: Home / Self Care | Payer: Medicaid (Managed Care) | Attending: Surgery | Admitting: Surgery

## 2023-04-10 DIAGNOSIS — L0501 Pilonidal cyst with abscess: Secondary | ICD-10-CM

## 2023-04-10 MED ORDER — SODIUM CHLORIDE 0.9 % IV SOLN
0.9 | INTRAVENOUS | Status: DC
Start: 2023-04-10 — End: 2023-04-10
  Administered 2023-04-10: 15:00:00 via INTRAVENOUS

## 2023-04-10 MED ORDER — ROCURONIUM BROMIDE 50 MG/5ML IV SOLN
50 | INTRAVENOUS | Status: AC
Start: 2023-04-10 — End: ?

## 2023-04-10 MED ORDER — LACTATED RINGERS IV SOLN
INTRAVENOUS | Status: DC
Start: 2023-04-10 — End: 2023-04-10

## 2023-04-10 MED ORDER — PROPOFOL 200 MG/20ML IV EMUL
200 | INTRAVENOUS | Status: AC
Start: 2023-04-10 — End: ?

## 2023-04-10 MED ORDER — FENTANYL CITRATE (PF) 100 MCG/2ML IJ SOLN
100 | Freq: Once | INTRAMUSCULAR | Status: DC | PRN
Start: 2023-04-10 — End: 2023-04-10

## 2023-04-10 MED ORDER — FENTANYL CITRATE (PF) 250 MCG/5ML IJ SOLN
250 | INTRAMUSCULAR | Status: AC
Start: 2023-04-10 — End: ?

## 2023-04-10 MED ORDER — FENTANYL CITRATE (PF) 250 MCG/5ML IJ SOLN
250 | Freq: Once | INTRAMUSCULAR | Status: DC | PRN
Start: 2023-04-10 — End: 2023-04-10
  Administered 2023-04-10 (×3): 50 via INTRAVENOUS

## 2023-04-10 MED ORDER — NORMAL SALINE FLUSH 0.9 % IV SOLN
0.9 | Freq: Two times a day (BID) | INTRAVENOUS | Status: DC
Start: 2023-04-10 — End: 2023-04-10

## 2023-04-10 MED ORDER — DEXAMETHASONE SODIUM PHOSPHATE 4 MG/ML IJ SOLN
4 | Freq: Once | INTRAMUSCULAR | Status: DC | PRN
Start: 2023-04-10 — End: 2023-04-10
  Administered 2023-04-10: 16:00:00 8 via INTRAVENOUS

## 2023-04-10 MED ORDER — NORMAL SALINE FLUSH 0.9 % IV SOLN
0.9 | INTRAVENOUS | Status: DC | PRN
Start: 2023-04-10 — End: 2023-04-10

## 2023-04-10 MED ORDER — SUGAMMADEX SODIUM 200 MG/2ML IV SOLN
200 | INTRAVENOUS | Status: AC
Start: 2023-04-10 — End: ?

## 2023-04-10 MED ORDER — DEXAMETHASONE SODIUM PHOSPHATE 4 MG/ML IJ SOLN
4 | INTRAMUSCULAR | Status: AC
Start: 2023-04-10 — End: ?

## 2023-04-10 MED ORDER — ONDANSETRON HCL 4 MG/2ML IJ SOLN
4 | Freq: Once | INTRAMUSCULAR | Status: DC | PRN
Start: 2023-04-10 — End: 2023-04-10
  Administered 2023-04-10: 16:00:00 4 via INTRAVENOUS

## 2023-04-10 MED ORDER — HYDROMORPHONE 0.5MG/0.5ML IJ SOLN
1 | Status: DC | PRN
Start: 2023-04-10 — End: 2023-04-10
  Administered 2023-04-10: 18:00:00 0.5 mg via INTRAVENOUS

## 2023-04-10 MED ORDER — DEXMEDETOMIDINE HCL 200 MCG/2ML IV SOLN
200 | Freq: Once | INTRAVENOUS | Status: DC | PRN
Start: 2023-04-10 — End: 2023-04-10
  Administered 2023-04-10: 16:00:00 8 via INTRAVENOUS

## 2023-04-10 MED ORDER — BUPIVACAINE-EPINEPHRINE 0.5% -1:200000 IJ SOLN
INTRAMUSCULAR | Status: DC | PRN
Start: 2023-04-10 — End: 2023-04-10
  Administered 2023-04-10: 17:00:00 10 via INTRAMUSCULAR

## 2023-04-10 MED ORDER — MIDAZOLAM HCL 5 MG/5ML IJ SOLN
5 | Freq: Once | INTRAMUSCULAR | Status: DC | PRN
Start: 2023-04-10 — End: 2023-04-10
  Administered 2023-04-10: 16:00:00 5 via INTRAVENOUS

## 2023-04-10 MED ORDER — BUPIVACAINE-EPINEPHRINE (PF) 0.5% -1:200000 IJ SOLN
INTRAMUSCULAR | Status: AC
Start: 2023-04-10 — End: ?

## 2023-04-10 MED ORDER — OXYCODONE HCL 5 MG PO TABS
5 | ORAL_TABLET | Freq: Four times a day (QID) | ORAL | 0 refills | Status: AC | PRN
Start: 2023-04-10 — End: 2023-04-13

## 2023-04-10 MED ORDER — HYDROMORPHONE 0.5MG/0.5ML IJ SOLN
1 | Status: DC | PRN
Start: 2023-04-10 — End: 2023-04-10

## 2023-04-10 MED ORDER — ONDANSETRON HCL 4 MG/2ML IJ SOLN
4 | INTRAMUSCULAR | Status: AC
Start: 2023-04-10 — End: ?

## 2023-04-10 MED ORDER — ROCURONIUM BROMIDE 50 MG/5ML IV SOLN
50 | Freq: Once | INTRAVENOUS | Status: DC | PRN
Start: 2023-04-10 — End: 2023-04-10
  Administered 2023-04-10: 16:00:00 10 via INTRAVENOUS

## 2023-04-10 MED ORDER — SODIUM CHLORIDE (PF) 0.9 % IJ SOLN
0.9 | INTRAMUSCULAR | Status: DC | PRN
Start: 2023-04-10 — End: 2023-04-10

## 2023-04-10 MED ORDER — DIPHENHYDRAMINE HCL 50 MG/ML IJ SOLN
50 | Freq: Once | INTRAMUSCULAR | Status: DC | PRN
Start: 2023-04-10 — End: 2023-04-10

## 2023-04-10 MED ORDER — PROPOFOL 200 MG/20ML IV EMUL
200 | Freq: Once | INTRAVENOUS | Status: DC | PRN
Start: 2023-04-10 — End: 2023-04-10
  Administered 2023-04-10: 16:00:00 150 via INTRAVENOUS
  Administered 2023-04-10: 16:00:00 50 via INTRAVENOUS

## 2023-04-10 MED ORDER — ONDANSETRON HCL 4 MG/2ML IJ SOLN
4 | Freq: Once | INTRAMUSCULAR | Status: DC | PRN
Start: 2023-04-10 — End: 2023-04-10

## 2023-04-10 MED ORDER — MIDAZOLAM HCL (PF) 2 MG/2ML IJ SOLN
2 | Freq: Once | INTRAMUSCULAR | Status: DC | PRN
Start: 2023-04-10 — End: 2023-04-10

## 2023-04-10 MED ORDER — SUGAMMADEX SODIUM 200 MG/2ML IV SOLN
200 | Freq: Once | INTRAVENOUS | Status: DC | PRN
Start: 2023-04-10 — End: 2023-04-10
  Administered 2023-04-10: 17:00:00 50 via INTRAVENOUS

## 2023-04-10 MED ORDER — LIDOCAINE HCL (PF) 1 % IJ SOLN
1 | Freq: Once | INTRAMUSCULAR | Status: DC | PRN
Start: 2023-04-10 — End: 2023-04-10

## 2023-04-10 MED ORDER — STERILE WATER FOR INJECTION (MIXTURES ONLY)
2 | Status: AC
Start: 2023-04-10 — End: 2023-04-10
  Administered 2023-04-10: 16:00:00 2000 mg via INTRAVENOUS

## 2023-04-10 MED ORDER — SODIUM CHLORIDE 0.9 % IV SOLN
0.9 | INTRAVENOUS | Status: DC | PRN
Start: 2023-04-10 — End: 2023-04-10

## 2023-04-10 MED ORDER — MIDAZOLAM HCL 5 MG/5ML IJ SOLN
5 | INTRAMUSCULAR | Status: AC
Start: 2023-04-10 — End: ?

## 2023-04-10 MED FILL — ONDANSETRON HCL 4 MG/2ML IJ SOLN: 4 MG/2ML | INTRAMUSCULAR | Qty: 2

## 2023-04-10 MED FILL — DEXAMETHASONE SODIUM PHOSPHATE 4 MG/ML IJ SOLN: 4 MG/ML | INTRAMUSCULAR | Qty: 1

## 2023-04-10 MED FILL — MIDAZOLAM HCL 5 MG/5ML IJ SOLN: 5 MG/ML | INTRAMUSCULAR | Qty: 5

## 2023-04-10 MED FILL — FENTANYL CITRATE (PF) 250 MCG/5ML IJ SOLN: 250 MCG/5ML | INTRAMUSCULAR | Qty: 5

## 2023-04-10 MED FILL — CEFAZOLIN SODIUM 2 G IJ SOLR: 2 g | INTRAMUSCULAR | Qty: 2000

## 2023-04-10 MED FILL — LACTATED RINGERS IV SOLN: INTRAVENOUS | Qty: 1000

## 2023-04-10 MED FILL — ROCURONIUM BROMIDE 50 MG/5ML IV SOLN: 50 MG/5ML | INTRAVENOUS | Qty: 5

## 2023-04-10 MED FILL — HYDROMORPHONE HCL 1 MG/ML IJ SOLN: 1 MG/ML | INTRAMUSCULAR | Qty: 0.5

## 2023-04-10 MED FILL — SENSORCAINE-MPF/EPINEPHRINE 0.5% -1:200000 IJ SOLN: INTRAMUSCULAR | Qty: 30

## 2023-04-10 MED FILL — SODIUM CHLORIDE 0.9 % IV SOLN: 0.9 % | INTRAVENOUS | Qty: 1000

## 2023-04-10 MED FILL — DIPRIVAN 200 MG/20ML IV EMUL: 200 MG/20ML | INTRAVENOUS | Qty: 20

## 2023-04-10 MED FILL — BRIDION 200 MG/2ML IV SOLN: 200 MG/2ML | INTRAVENOUS | Qty: 2

## 2023-04-10 MED FILL — BD POSIFLUSH 0.9 % IV SOLN: 0.9 % | INTRAVENOUS | Qty: 40

## 2023-04-10 NOTE — Discharge Instructions (Addendum)
 POST-OPERATIVE INSTRUCTIONS  OUTPATIENT SURGERY    Below is a list of instructions which are important for you to follow. If you have any questions, please call your surgeon's office.    1 EATING: Please eat lightly when you go home today for the first 24

## 2023-04-10 NOTE — H&P (Signed)
 04/10/2023    Chart reviewed and patient interviewed. No new changes since current H&P.

## 2023-04-10 NOTE — Brief Op Note (Signed)
 Brief Postoperative Note      Patient: Derek Dudley  Date of Birth: 09-Jan-2001  MRN: [id number]    Date of Procedure: 17-Apr-2023    Pre-Op Diagnosis Codes:      * Pilonidal cyst with abscess [L05.01]    Post-Op Diagnosis: Same       Procedure(s):  EXCISION

## 2023-04-10 NOTE — Op Note (Signed)
 Operative Note      Patient: Derek Dudley  Date of Birth: [birth year]  MRN: [id number]    Date of Procedure: 04-12-2023    Pre-Op Diagnosis Codes:      * Pilonidal cyst with abscess [L05.01], complex with flap closure    Post-Op Diagnosis: Same       Procedu

## 2023-04-10 NOTE — Anesthesia Pre-Procedure Evaluation (Signed)
 Department of Anesthesiology  Preprocedure Note       Name:  Derek Dudley   Age:  22 y.o.  DOB:  25-Oct-2000                                          MRN:  [id number]         Date:  04/10/2023      Surgeon: Moishe Spice):  Verdis Frederickson, MD    Procedure:

## 2023-04-10 NOTE — Other (Signed)
 Patient Fall Protocol  Yellow arm band applied to patient and yellow non skid socks placed on  Bed in low position, all side rails up, call bell in reach  Pt and Family instructed in "call- don't fall" protocol   -use your call bell, wait for assistance, s

## 2023-04-20 NOTE — Telephone Encounter (Signed)
 Patient called in stating his excision site is intermittently bleeding at random times and there is surrounding redness. Patient denies experiencing any fever, chills, swelling or pain. Patient is scheduled for 2 week post op visit on Thursday 04/22/2022 b

## 2023-04-20 NOTE — Telephone Encounter (Signed)
 Returned call to patient and verified using 2 identifiers. Pt is 10 days postop pilonidal cyst and states that he has noticed some bleeding at times from the incision. When asked did he notice the drainage after having a bowel movement. He stated no but it

## 2023-04-21 ENCOUNTER — Ambulatory Visit
Admit: 2023-04-21 | Discharge: 2023-04-21 | Payer: Medicaid (Managed Care) | Attending: Surgery | Admitting: Surgery | Primary: Family Medicine

## 2023-04-21 VITALS — BP 110/60 | HR 79 | Temp 98.80000°F | Resp 14 | Ht 66.0 in | Wt 181.2 lb

## 2023-04-21 DIAGNOSIS — L0501 Pilonidal cyst with abscess: Principal | ICD-10-CM

## 2023-04-21 NOTE — Progress Notes (Signed)
 Identified pt with two pt identifiers (name and DOB). Reviewed chart in preparation for visit and have obtained necessary documentation.    Derek Dudley is a 22 y.o. male  Chief Complaint   Patient presents with    Post-Op Check     Pilonidal cyst with abscess complex with flap closure  12/20       BP 110/60 (Site: Right Upper Arm, Position: Sitting, Cuff Size: Large Adult)   Pulse 79   Temp 98.8 F (37.1 C) (Oral)   Resp 14   Ht 1.676 m (5\' 6" )   Wt 82.2 kg (181 lb 3.2 oz)   SpO2 98%   BMI 29.25 kg/m     1. Have you been to the ER, urgent care clinic since your last visit?  Hospitalized since your last visit?no    2. Have you seen or consulted any other health care providers outside of the [facility] since your last visit?  Include any pap smears or colon screening. No    Pt reports increased dark bloody drainage noted.

## 2023-04-21 NOTE — Progress Notes (Signed)
 Surgery Progress Note    04/21/2023    had concerns including Post-Op Check (Pilonidal cyst with abscess complex with flap closure  12/20/).     Subjective:     Patient is a 22 y.o. male status post excision of pilonidal cyst.  Patient reports doing well.  Does report there is still drainage from the surgical site.  Denies any fevers or chills.  Reports it is likely the sutures that are bothering him the most.      Past Medical History:   Diagnosis Date    ADHD (attention deficit hyperactivity disorder)     Chronic back pain         Past Surgical History:   Procedure Laterality Date    ANTERIOR CRUCIATE LIGAMENT REPAIR Right     acl repair    PILONIDAL CYST EXCISION N/A 04/10/2023    EXCISION PILONIDAL CYST performed by Warren Haber, MD at [facility] MAIN OR    UMBILICAL HERNIA REPAIR      WISDOM TOOTH EXTRACTION            Objective:     Vitals:    04/21/23 1032   BP: 110/60   Pulse: 79   Resp: 14   Temp: 98.8 F (37.1 C)   SpO2: 98%     Body mass index is 29.25 kg/m.  Wt Readings from Last 3 Encounters:   04/21/23 82.2 kg (181 lb 3.2 oz)   04/10/23 83.8 kg (184 lb 11.9 oz)   04/08/23 84.5 kg (186 lb 3.2 oz)        General: No acute distress, conversant  Eyes: PERRLA, no scleral icterus  HENT: Normocephalic, PEERLA  Skin: Incision appears clean, no erythema.  Upon squeezing incision there is some serosanguinous and purulent drainage from the inferior aspect of the incision.  A decent amount was drained out after squeezing several times.  The nylon stitches were removed at the bedside.  Steri-Strips were applied and a compression dressing with gauze was applied with tape.  Psych: Appropriate mood and affect      Assessment / Plan:     1. Pilonidal cyst with abscess  2. Postoperative visit        22 y.o. y/o male status post excision of pilonidal cyst.    He may sit at this time but recommend no exercise since such as squats.  He may shower normal.  I showed the patient how to apply and make a compression dressing.   Recommend that he do this is much as possible to collapse that space.  Follow back up in 2 weeks.    Pathology was discussed  Satisfactory post-op course        Warren Haber, MD   [facility]

## 2023-04-22 NOTE — Anesthesia Postprocedure Evaluation (Signed)
 Department of Anesthesiology  Postprocedure Note    Patient: Derek Dudley  MRN: [id number]  Birthdate: 2001-01-22  Date of evaluation: 04/22/2023    Procedure Summary       Date: 04/10/23 Room / Location: [facility] MAIN OR F4 / [facility] MAIN OR    Anesthesia Start: 1103 Anesthesia Stop: 1228    Procedure: EXCISION PILONIDAL CYST (Coccyx) Diagnosis:       Pilonidal cyst with abscess      (Pilonidal cyst with abscess [L05.01])    Surgeons: Chrystal Elijah BRAVO, MD Responsible Provider: Tennis Agent, DO    Anesthesia Type: General ASA Status: 1            Anesthesia Type: General    Aldrete Phase I: Aldrete Score: 8    Aldrete Phase II: Aldrete Score: 10    Anesthesia Post Evaluation    Comments: Patient discharged by PACU nursing without anesthesiologist evaluation.      No notable events documented.

## 2023-05-05 ENCOUNTER — Ambulatory Visit: Admit: 2023-05-05 | Discharge: 2023-05-05 | Payer: PRIVATE HEALTH INSURANCE | Primary: Family Medicine

## 2023-05-05 VITALS — BP 108/60 | HR 54 | Temp 98.00000°F | Resp 16 | Ht 66.0 in | Wt 184.5 lb

## 2023-05-05 DIAGNOSIS — L0501 Pilonidal cyst with abscess: Secondary | ICD-10-CM

## 2023-05-05 NOTE — Progress Notes (Signed)
Surgery Progress Note    05/05/2023    had concerns including Post-Op Check (3 weeks 4 days post op Excision pilonidal cyst) and Wound Check.     Subjective:     Patient is a 23 y.o. male underwent excision of pilonidal cyst/abscess on 04/10/2023 with Dr. Lauretta Grill.  He was seen in clinic on 12/31 where he complained of some mild pain around the areas of his stitches, there was some mild pus expressed at inferior aspect of the incision, otherwise doing well.  Today he states his pain is largely resolved.  Has not had any drainage from the surgical wound.  Has not had any fevers, chills, nausea, vomiting, diarrhea, dyschezia.      Past Medical History:   Diagnosis Date    ADHD (attention deficit hyperactivity disorder)     Chronic back pain         Past Surgical History:   Procedure Laterality Date    ANTERIOR CRUCIATE LIGAMENT REPAIR Right     acl repair    PILONIDAL CYST EXCISION N/A 04/10/2023    EXCISION PILONIDAL CYST performed by Verdis Frederickson, MD at [facility] MAIN OR    UMBILICAL HERNIA REPAIR      WISDOM TOOTH EXTRACTION            Objective:     Vitals:    05/05/23 1431   BP: 108/60   Pulse: 54   Resp: 16   Temp: 98 F (36.7 C)   SpO2: 97%     Body mass index is 29.78 kg/m.  Wt Readings from Last 3 Encounters:   05/05/23 83.7 kg (184 lb 8 oz)   04/21/23 82.2 kg (181 lb 3.2 oz)   04/10/23 83.8 kg (184 lb 11.9 oz)        General: No acute distress, conversant  Eyes: PERRLA, no scleral icterus  HENT: Normocephalic, PEERLA  GI: Soft, NT, ND, there is a well-healed surgical incision at the gluteal cleft.  No tenderness, no redness or fluctuant  Psych: Appropriate mood and affect      Assessment / Plan:     Patient doing well, Satisfactory post-op course  No heavy lifting (>15lbs) for 4 weeks from date of surgery.  He may submerge the incision underwater as needed.  Currently asymptomatic.  Will follow-up on an as-needed basis.  All questions answered to patients satisfaction.       Linwood Dibbles, PA   [facility]

## 2023-05-05 NOTE — Progress Notes (Signed)
Identified pt with two pt identifiers (name and DOB). Reviewed chart in preparation for visit and have obtained necessary documentation.    Derek Dudley is a 23 y.o. male  Chief Complaint   Patient presents with    Post-Op Check     3 weeks 4 days post op Excision pilonidal cyst    Wound Check     BP 108/60 (Site: Right Upper Arm, Position: Sitting, Cuff Size: Large Adult)   Pulse 54   Temp 98 F (36.7 C) (Oral)   Resp 16   Ht 1.676 m (5\' 6" )   Wt 83.7 kg (184 lb 8 oz)   SpO2 97%   BMI 29.78 kg/m     1. Have you been to the ER, urgent care clinic since your last visit?  Hospitalized since your last visit?no    2. Have you seen or consulted any other health care providers outside of the [facility] since your last visit?  Include any pap smears or colon screening. no

## 2023-05-08 ENCOUNTER — Ambulatory Visit: Admit: 2023-05-08 | Discharge: 2023-05-08 | Payer: PRIVATE HEALTH INSURANCE | Primary: Family Medicine

## 2023-05-08 ENCOUNTER — Ambulatory Visit
Admit: 2023-05-08 | Discharge: 2023-05-08 | Payer: PRIVATE HEALTH INSURANCE | Attending: Family Medicine | Primary: Family Medicine

## 2023-05-08 VITALS — BP 126/75 | HR 51 | Temp 98.10000°F | Ht 66.0 in | Wt 188.2 lb

## 2023-05-08 DIAGNOSIS — M25532 Pain in left wrist: Secondary | ICD-10-CM

## 2023-05-08 NOTE — Progress Notes (Signed)
Subjective  Chief Complaint   Patient presents with    Other     Left wrist pain     HPI:  Derek Dudley is a 23 y.o. male who presented to the clinic complaining of left wrist pain, onset 2 days ago. The patient states that while he was doing squats, his left wrist experienced a muscle spasm. His left wrist then proceeded to flex, and he heard a popping sound. The patient states that since this, he has experienced pain in his left wrist. He placed his wrist in a wrist splint which he already had. The patient reports that his pain is better than it was yesterday, but he still decided to come to the clinic for an evaluation. The patient denies any numbness in his left hand, tingling in his left hand, focal weakness, or any other consitutional symptoms at this time.         Past Medical History:   Diagnosis Date    ADHD (attention deficit hyperactivity disorder)     Chronic back pain      No current outpatient medications on file prior to visit.     No current facility-administered medications on file prior to visit.     No Known Allergies    Objective  Vitals:    05/08/23 1008   BP: 126/75   Pulse: 51   Temp: 98.1 F (36.7 C)     Wt Readings from Last 3 Encounters:   05/08/23 85.4 kg (188 lb 3.2 oz)   05/05/23 83.7 kg (184 lb 8 oz)   04/21/23 82.2 kg (181 lb 3.2 oz)     Physical Exam  Constitutional:       Appearance: Normal appearance.   Cardiovascular:      Rate and Rhythm: Normal rate and regular rhythm.      Pulses: Normal pulses.      Heart sounds: Normal heart sounds.   Pulmonary:      Effort: Pulmonary effort is normal.      Breath sounds: Normal breath sounds.   Abdominal:      General: Abdomen is flat.      Palpations: Abdomen is soft.   Musculoskeletal:         General: Signs of injury present.      Comments: The patient's left wrist is mildly swollen compared to his right, but no erythema or signs of trauma are noted. The left wrist has limited ROM secondary to pain. The patient has decreased left wrist  extension and decreased left wrist adduction secondary to pain. However, the patient has no limitation or pain with left wrist flexion and abduction. The patient has full passive ROM. The patient is able to make a fist without pain.    Skin:     General: Skin is warm and dry.      Findings: No bruising or erythema.   Neurological:      General: No focal deficit present.      Mental Status: He is alert and oriented to person, place, and time.   Psychiatric:         Mood and Affect: Mood normal.          Assessment & Plan    Muscle strain   Specifically of the left wrist extensors   Specifically of the left wrist adductors  Ligament sprain   Unspecified injury to left wrist   Ordered xray of the left wrist   The patient was referred to Orthopedist  The  patient advised to rest, and ice the wrist while he awaits for an appointment. He was also advised to avoid exercises that have him utilize his wrist muscles in the meantime.     Aspects of this note have been generated using voice recognition software. Despite editing, there may be some syntax errors    Tresa Garter

## 2023-05-08 NOTE — Progress Notes (Signed)
"  Have you been to the ER, urgent care clinic since your last visit?  Hospitalized since your last visit?"    YES - When: approximately 1 months ago.  Where and Why: [place]/ Cyst on tailbone.    "Have you seen or consulted any other health care providers outside our system since your last visit?"    NO

## 2023-05-08 NOTE — Progress Notes (Signed)
Subjective  Chief Complaint   Patient presents with    Other     Left wrist pain     HPI:  Derek Dudley is a 23 y.o. male.    Left wrist pain - patient was performing squats in a machine while holding onto handles for stabilization. He had a spasm in his left arm causing him to flex suddenly, causing acute pain in his medial wrist with an audible 'pop'. Patient has taking OTC NSAIDS, used a splint and rested it. Patient reports improved pain today.    Past Medical History:   Diagnosis Date    ADHD (attention deficit hyperactivity disorder)     Chronic back pain      No current outpatient medications on file prior to visit.     No current facility-administered medications on file prior to visit.     No Known Allergies    Objective  Vitals:    05/08/23 1008   BP: 126/75   Pulse: 51   Temp: 98.1 F (36.7 C)     Wt Readings from Last 3 Encounters:   05/08/23 85.4 kg (188 lb 3.2 oz)   05/05/23 83.7 kg (184 lb 8 oz)   04/21/23 82.2 kg (181 lb 3.2 oz)     Physical Exam  Constitutional:       Appearance: Normal appearance.   HENT:      Head: Normocephalic and atraumatic.   Musculoskeletal:        Arms:       Comments: Tender along medial wrist. Full passive range off motion, pain with wrist extension and adduction.   Neurological:      General: No focal deficit present.      Mental Status: He is alert and oriented to person, place, and time.   Psychiatric:         Mood and Affect: Mood normal.         Behavior: Behavior normal.          Assessment & Plan  1. Left wrist pain  Comments:  Appears to be tendon strain, but some concern for tendon/ligament tear. Continue rest, NSAIDs and splint. Refer to ortho given young age and active lifestyle.  Orders:  -     XR WRIST LEFT (MIN 3 VIEWS)  -     [facility] - Pollyann Glen, MD, Orthopedic Surgery (elbow, hand, wrist), [place] ([address])        Aspects of this note have been generated using voice recognition software. Despite editing, there may be some syntax  errors    Roma Schanz, MD

## 2023-05-12 NOTE — Other (Signed)
Normal x-ray as we discussed.

## 2023-07-20 ENCOUNTER — Ambulatory Visit
Admit: 2023-07-20 | Discharge: 2023-07-20 | Payer: PRIVATE HEALTH INSURANCE | Attending: Family Medicine | Primary: Family Medicine

## 2023-07-20 VITALS — BP 130/67 | HR 50 | Temp 97.50000°F | Ht 66.0 in | Wt 179.4 lb

## 2023-07-20 DIAGNOSIS — M25511 Pain in right shoulder: Secondary | ICD-10-CM

## 2023-07-20 NOTE — Progress Notes (Signed)
 "  Have you been to the ER, urgent care clinic since your last visit?  Hospitalized since your last visit?"    NO    "Have you seen or consulted any other health care providers outside our system since your last visit?"    NO

## 2023-07-20 NOTE — Assessment & Plan Note (Signed)
 Likely muscle strain/overuse from restarting basketball.  Symptoms should continue to improve as exercise tolerance increases.  If not improving, patient will reach out.

## 2023-07-20 NOTE — Progress Notes (Signed)
 Subjective  Chief Complaint   Patient presents with    Other     Right shoulder stiffness     HPI:  Derek Dudley is a 23 y.o. male.    History of Present Illness  The patient presents for evaluation of right shoulder stiffness, right knee pain, and left wrist pain.    The chief complaint is stiffness in the right shoulder, which has been noticeable for the past 3 weeks following increased basketball activity. The stiffness is described as deep-seated, with sensations of locking and limited range of motion, particularly during arm extension. He notes a catching sensation but does not experience any clicking. The stiffness worsens with prolonged play. Adherence to a physical therapy regimen has been maintained as recommended by his therapist. A history of dislocating the left shoulder is noted, which currently exhibits a clicking sound but no discomfort.    An incident during a basketball game on 07/18/2023 resulted in being tackled from the right side, causing the knee to buckle inward with a popping sound. Since then, stiffness and pain in the knee have been experienced, although it is not believed to be a recurrence of a previous ACL tear. Pain management has included RICE Recent onset of pain radiating from the hamstring to the glute is reported, suspected to be due to altered gait. Pain is experienced when attempting to fully extend the knee. Regular stretching exercises have been performed. A history of ACL tear is noted.    Discomfort in the left wrist persists, described as a popping sensation that provides temporary relief from stiffness. The wrist continues to exhibit stiffness and discomfort during flexion in both directions, although the severity has decreased. Regular use of the wrist at work and during exercise has been maintained. Initial immobilization in a splint for a week following the onset of symptoms is noted.    A TB form is needed for employment purposes.    PAST SURGICAL HISTORY:  - History  of ACL tear  - History of left shoulder dislocation        Past Medical History:   Diagnosis Date    ADHD (attention deficit hyperactivity disorder)     Chronic back pain      No current outpatient medications on file prior to visit.     No current facility-administered medications on file prior to visit.     No Known Allergies    Objective  Vitals:    07/20/23 0839   BP: 130/67   Pulse: 50   Temp: 97.5 F (36.4 C)     Body mass index is 28.96 kg/m.   Wt Readings from Last 3 Encounters:   07/20/23 81.4 kg (179 lb 6.4 oz)   05/08/23 85.4 kg (188 lb 3.2 oz)   05/05/23 83.7 kg (184 lb 8 oz)     Physical Exam  Constitutional:       Appearance: Normal appearance.   HENT:      Head: Normocephalic and atraumatic.   Musculoskeletal:         General: Normal range of motion.      Comments: Right knee with mild swelling.  Pain noted in right lateral hamstring on full extension of right knee.   Neurological:      General: No focal deficit present.      Mental Status: He is alert and oriented to person, place, and time.   Psychiatric:         Mood and Affect: Mood normal.  Behavior: Behavior normal.          Assessment & Plan  1. Acute pain of right shoulder  Assessment & Plan:   Likely muscle strain/overuse from restarting basketball.  Symptoms should continue to improve as exercise tolerance increases.  If not improving, patient will reach out.  2. Left wrist pain  Comments:  Patient still notes some stiffness, but only wore brace for a week.  Advised to try rest with brace again.  3. Acute pain of right knee  Comments:  Hopefully just strain.  Knee stable on exam today.  Advised to continue RICE and to try NSAIDs.  If not improving will consider further evaluation.        Aspects of this note have been generated using voice recognition software. Despite editing, there may be some syntax errors    Roma Schanz, MD

## 2023-08-03 ENCOUNTER — Encounter

## 2023-08-07 MED ORDER — PREDNISONE 20 MG PO TABS
20 | ORAL_TABLET | Freq: Two times a day (BID) | ORAL | 0 refills | 6.00000 days | Status: AC
Start: 2023-08-07 — End: 2023-08-12

## 2023-08-10 ENCOUNTER — Other Ambulatory Visit: Admit: 2023-08-10 | Discharge: 2023-08-10 | Payer: Medicaid (Managed Care) | Primary: Family Medicine

## 2023-08-11 LAB — ANA, DIRECT, W/REFLEX: Antinuclear Antibodies (ANA): NEGATIVE

## 2023-08-11 LAB — SEDIMENTATION RATE: Sed Rate, Automated: 3 mm/h (ref 0–15)

## 2023-08-12 LAB — URIC ACID: Uric Acid: 5.5 mg/dL (ref 3.8–8.4)

## 2023-08-12 LAB — CCP ANTIBODIES IGG/IGA: CCP Antibodies IgG/IgA: 8 U (ref 0–19)

## 2023-08-13 NOTE — Other (Signed)
 Negative for rheumatoid arthritis, gout, lupus and general inflammation.  It does not appear that your multiple joint issues come from a central source.

## 2023-09-28 NOTE — Telephone Encounter (Signed)
 Placed outbound call to patient after receiving second MyChart appt message request from patient. Patient did not answer, LVM to patient advising we had sent an appt scheduling ticket in his first MyChart message to our office. Advised he can schedule appt there or give our office a call back. Provided office contact info in message.

## 2023-09-30 NOTE — Telephone Encounter (Signed)
 Placed outbound call to patient in regards to previous appt request and non response from previous outbound call. Patient did not answer, LVM requesting call back from patient to schedule or can schedule appt via MyChart scheduling ticket in first MyChart appt request.

## 2023-11-30 ENCOUNTER — Ambulatory Visit: Admit: 2023-11-30 | Discharge: 2023-12-04 | Payer: Medicaid (Managed Care) | Primary: Family Medicine

## 2023-11-30 ENCOUNTER — Ambulatory Visit: Admit: 2023-11-30 | Payer: Medicaid (Managed Care) | Primary: Family Medicine

## 2023-11-30 VITALS — BP 112/70 | HR 52 | Temp 97.90000°F | Ht 66.0 in | Wt 168.9 lb

## 2023-11-30 DIAGNOSIS — M25561 Pain in right knee: Principal | ICD-10-CM

## 2023-11-30 NOTE — Progress Notes (Signed)
 "  Have you been to the ER, urgent care clinic since your last visit?  Hospitalized since your last visit?"    NO    "Have you seen or consulted any other health care providers outside our system since your last visit?"    NO

## 2023-11-30 NOTE — Progress Notes (Signed)
 PCP: Cleotilde Bruckner, MD  Referring Provider: No ref. provider found     CC: Other (Injured right foot and right knee has been hurting/ stiffness as well. Patient has been doing otc meds. )      Subjective      Derek Dudley is a 23 y.o. male,Established patient, who presents for right knee pain and right foot pain.      Right knee pain: Ongoing since about March after playing basketball.  He states that he was hit from the lateral aspect, and his knee buckled medially and he heard a pop in his knee.  He did have swelling the next day.  Most of the pain continues to be on the lateral aspect of the knee.  He denies any buckling of the knee.  He does endorse some pain with activities such as going up and down the stairs.  He has been able to play basketball since, and felt that after playing yesterday his knee pain did flareup he does have a history of ACL reconstruction using the patellar BTB graft.  He does also play pickle ball.    Right foot pain: Ongoing for about 2 weeks.  He notes that while playing basketball he planted his foot, and felt a pop on the top of his foot.  He did have some initial swelling, but that has since resolved, most of his pain has resolved.  He has been taking Tylenol and Motrin  as needed.  He denies any numbness or tingling in his foot.    I have reviewed past medical, surgical, social, and family histories.       Objective      Vitals:    11/30/23 1334   BP: 112/70   BP Site: Right Upper Arm   Patient Position: Sitting   BP Cuff Size: Medium Adult   Pulse: 52   Temp: 97.9 F (36.6 C)   TempSrc: Oral   SpO2: 98%   Weight: 76.6 kg (168 lb 14.4 oz)   Height: 1.676 m (5' 6)      Body mass index is 27.26 kg/m.       Physical Exam:  Gen: Patient is well-nourished, well-developed and in no overt distress.  Respiratory: No respiratory distress, normal effort. Able to speak in full sentences.  Skin: No concerning abnormalities noted in areas visualized.  Psych: Normal mood, normal  affect.  Neuro: Patient is awake, alert, and answers questions appropriately and accurately.  HEENT: Patient presents with non-icteric sclera.  Extremities: No cyanosis or clubbing.  MSK:     Right Knee Exam  Abnormal findings: Pain with McMurray's on the lateral joint line, mild tenderness to palpation at the distal biceps femoris tendon at the hamstrings  Inspection: No obvious deformities. No bruising, redness, swelling, or rash noted.   Palpation: No tenderness to palpation at the medial or lateral joint line, over the patellar or quadriceps tendon, or at the popliteal fossa  Range of motion: Full AROM with flexion and extension. No pain with AROM as well.  Strength: 5/5 strength without pain in flexion and extension  Special tests:  Negative Lachman's, Negative varus and valgus stress test, Negative anterior drawer, Negative posterior drawer  Neurovascular: Patient sensation intact. Popliteal pulse 2+    Right Foot and Ankle Exam  Abnormal findings: Mild TTP over the EHL, and mild pain over the EHL with resisted dorsiflexion of the entire foot, but no pain with resisted extension of the great toe alone  Inspection: No abnormalities.  No swelling or deformity noted.  Palpation: No TTP of navicular, remaining tarsal bones, soft tissues, metatarsals including base of fifth, all 5 toes throughout, the fibular head, lateral malleolus, medial malleolus, or elsewhere in the foot, ankle, or lower leg.  Range of motion: Intact AROM of the ankle and foot including all toes.  Strength: 5/5 strength in all tested fields including all toes.  Special Tests: No calf asymmetry, knot or cord noticed. Negative Homan's sign. Normal Thompson's.  Neurovascular: Normal neurovasculature distally including sensation and cap refill as well as 2+ DP/PT pulses.  Gait: Normal gait.       Imaging Interpretation:     Independently reviewed 3 view plain films of the right knee which shows patella alta, bone tunnel changes within the tibia and  femur consistent with ACL reconstruction, mild inferior spurring of the patella, well-preserved joint space.  No acute fracture.  No malalignment.    Independently reviewed three-view plain films of the right foot Mild hallux valgus of the first great toe, no acute fracture, no malalignment.    Xray Result (most recent):  XR WRIST LEFT (MIN 3 VIEWS) 05/08/2023    Narrative  EXAM: XR WRIST LEFT (MIN 3 VIEWS)    INDICATION: Pain in left wrist.    COMPARISON: None.    FINDINGS: Three views of the left wrist demonstrate no fracture or other acute  osseous or articular abnormality. The soft tissues are within normal limits.  Joints are within normal limits.    Impression  Normal left wrist views.    Electronically signed by Marolyn Boroughs      Assessment & Plan      Assessment & Plan  Right knee pain, unspecified chronicity  - X-ray results show minor spurring on the lower part of the kneecap, not the source of discomfort.  - Given contact injury to the lateral knee, delayed swelling, and pain in the lateral joint line with McMurray's testing raises concern for potential meniscus damage.  - MRI of the knee will be ordered to assess meniscus integrity; treatment plan will follow based on findings.  - Advised to apply Voltaren gel topically to the knee up to 4 times daily if necessary.  - Will follow-up with patient after MRI results to discuss next steps  Right foot pain   - Foot x-ray does not reveal any fractures or breaks.  - This and physical seems most consistent with tendinitis of the extensor houses longus tendon given some tenderness fluid noted on limited bedside ultrasound  - Advised to rest, apply ice, and take anti-inflammatory medications; avoid activities that exacerbate pain.  - Recommended topical application of Voltaren gel up to 4 times daily.     An electronic signature was used to authenticate this note.    --Archie Bonds, DO     This patient was referred to Orthopedics/Sports Medicine by No ref. provider  found. We appreciate this consult. Patient will continue to follow with PCP for their regular primary care needs.    The patient (or guardian, if applicable) and other individuals in attendance with the patient were advised that Artificial Intelligence will be utilized during this visit to record, process the conversation to generate a clinical note, and support improvement of the AI technology. The patient (or guardian, if applicable) and other individuals in attendance at the appointment consented to the use of AI, including the recording.

## 2023-12-07 ENCOUNTER — Encounter

## 2023-12-22 ENCOUNTER — Inpatient Hospital Stay: Admit: 2023-12-22 | Payer: Medicaid (Managed Care) | Primary: Family Medicine

## 2023-12-22 DIAGNOSIS — M25561 Pain in right knee: Principal | ICD-10-CM

## 2023-12-31 ENCOUNTER — Ambulatory Visit
Admit: 2023-12-31 | Discharge: 2023-12-31 | Payer: Medicaid (Managed Care) | Attending: Adult Reconstructive Orthopaedic Surgery | Primary: Family Medicine

## 2023-12-31 DIAGNOSIS — M2391 Unspecified internal derangement of right knee: Principal | ICD-10-CM

## 2023-12-31 MED ORDER — DICLOFENAC SODIUM 75 MG PO TBEC
75 | ORAL_TABLET | Freq: Two times a day (BID) | ORAL | 0 refills | Status: AC | PRN
Start: 2023-12-31 — End: ?

## 2023-12-31 NOTE — Progress Notes (Signed)
 12/31/2023    Chief Complaint: Right knee pain    HPI: This is a(n) 23 y.o. male  who complains of Right knee pain.  Onset was sudden 2 to 3 months ago when he had an injury while playing basketball.  He has an ACL reconstruction from approximately 10 years ago.  The patient has had pain for 2 to 3 months, he stated it was getting better but then began to get worse again, no management so far.   The pain is in the lateral knee, it is severe at times in intensity.   The patient has tried activity modification, no physical therapy, injections have not been done.   The pain causes some limitation with sports.   The patient does not complain of feelings of instability in the knee.  He did wear a brace in the past which he said helped.    Past Medical History:   Diagnosis Date    ADHD (attention deficit hyperactivity disorder)     Chronic back pain        Past Surgical History:   Procedure Laterality Date    ANTERIOR CRUCIATE LIGAMENT REPAIR Right     acl repair    PILONIDAL CYST EXCISION N/A 04/10/2023    EXCISION PILONIDAL CYST performed by Glan[place], Joanne E, MD at [facility] MA[facility] OR    UMBILICAL HERNIA REPAIR      WISDOM TOOTH EXTRACTION         No current outpatient medications on file prior to visit.     No current facility-administered medications on file prior to visit.       No Known Allergies    Family History   Problem Relation Age of Onset    Anemia Mother     Arthritis Mother     Depression Mother     Lupus Mother     Arthritis Father     Depression Father     Depression Sister        Social History     Socioeconomic History    Marital status: Single     Spouse name: None    Number of children: None    Years of education: None    Highest education level: None   Tobacco Use    Smoking status: Never    Smokeless tobacco: Never   Vaping Use    Vaping status: Former    Substances: Nicotine, Flavoring    Devices: Disposable, Pre-filled or refillable cartridge, Pre-filled pod   Substance and Sexual Activity    Alcohol  use: Yes     Alcohol/week: 2.0 standard drinks of alcohol     Types: 2 Drinks containing 0.5 oz of alcohol per week     Comment: monthly    Drug use: Not Currently     Types: Marijuana Oda)    Sexual activity: Yes     Partners: Female     Social Drivers of Health     Financial Resource Strain: Low Risk  (11/06/2022)    Overall Financial Resource Strain (CARDIA)     Difficulty of Paying Living Expenses: Not hard at all   Food Insecurity: No Food Insecurity (05/08/2023)    Hunger Vital Sign     Worried About Running Out of Food in the Last Year: Never true     Ran Out of Food in the Last Year: Never true   Transportation Needs: No Transportation Needs (05/08/2023)    PRAPARE - Transportation     Lack of Transportation (  Medical): No     Lack of Transportation (Non-Medical): No   Physical Activity: Sufficiently Active (11/03/2022)    Exercise Vital Sign     Days of Exercise per Week: 5 days     Minutes of Exercise per Session: 60 min   Housing Stability: Low Risk  (05/08/2023)    Housing Stability Vital Sign     Unable to Pay for Housing in the Last Year: No     Number of Times Moved in the Last Year: 0     Homeless in the Last Year: No         Review of Systems:       General: Denies headache, lethargy, fever, weight loss  Ears/Nose/Throat: Denies ear discharge, drainage, nosebleeds, hoarse voice, dental problems  Cardiovascular: Denies chest pain, shortness of breath  Lungs: Denies chest pain, breathing problems, wheezing, pneumonia  Stomach: Denies stomach pain, heartburn, constipation, irritable bowel  Skin: Denies rash, sores, open wounds  Musculoskeletal: Admits to knee pain, no deformity.  Genitourinary: Denies dysuria, hematuria, polyuria  Gastrointestinal: Denies constipation, obstipation, diarrhea  Neurological: Denies changes in sight, smell, hearing, taste, seizures. Denies loss of consciousness.  Psychiatric: Denies depression, sleep pattern changes, anxiety, change in personality  Endocrine: Denies mood  swings, heat or cold intolerance  Hematologic/Lymphatic: Denies anemia, purpura, petechia  Allergic/Immunologic: Denies swelling of throat, pain or swelling at lymph nodes      Physical Examination:    There were no vitals filed for this visit.     General: AOX3, no apparent distress  Psychiatric: mood and affect appropriate  Lungs: breathing is symmetric and unlabored bilaterally  Heart: regular rate and rhythm  Abdomen: no guarding  Head: normocephalic, atraumatic  Skin: No significant abnormalities, good turgor  Sensation intact to light touch: L1-S1 dermatomes  Muscular exam: 5/5 strength in all major muscle groups unless noted in specialty exam.    Extremities:      Left upper extremity: Full active and passive range of motion without pain, deformity, no open wound, strength 5/5 in all major muscle groups.    Right upper extremity: Full active and passive range of motion without pain, deformity, no open wound, strength 5/5 in all major muscle groups.    Left lower extremity: Full active and passive range of motion without pain, deformity, no open wound, strength 5/5 in all major muscle groups.    Right lower extremity:  No deformity is noted.  Range of motion of the knee is 0-1 30.   Ligamentous testing of the knee indicates stability of the the ACL, PCL, LCL, MCL.  Lachman's, anterior and posterior drawer tests are specifically negative. Joint line tenderness to palpation laterally.  Popliteal area is unremarkable.   No effusion.  No patellar crepitus.  Patella tracks centrally with a negative apprehension and grind test.  Pivot shift is negative.  Strength testing is indicative of 5/5 strength at hip flexion, extension, knee flexion and extension, tibialis anterior, EHL, and FHL.  Sensation is intact to light touch in the L1-S1 dermatomes.  Capillary refill is less than 2 seconds in the toes.  Well-healed surgical scars appear benign    Diagnostics:    Pertinent Diagnostics:   MRI ordered by his PCP and  independently reviewed by myself indicates multiple areas of chondral loss in the lateral knee, lateral femoral condyle and lateral tibia, small meniscal tear on the lateral side, previous ACL reconstruction, multiple areas of bony edema, specifically in the lateral compartment.  Assessment: Pain in right knee, likely contusion, early chondrosis, ACL reconstruction    Plan:  This patient has above-mentioned injury, the plan will be to have him proceed with physical therapy, anti-inflammatory medications and bracing when he does sports.  I encouraged him to dissipate in gentle of progressive activities to his tolerance.  I given him the realistic expectation that his knee, regardless of management, may be imperfect going forward and he may be prone to injuries such as this.  I do believe that this is more of an edema and contusion type of injury and he will require some time to recover from this and therefore therapy and anti-inflammatories, diclofenac , were prescribed today and he will proceed with this for the time being.  The plan would be to have him follow-up in approximately 6 to 8 weeks should he have persistent pains, there may be a role for surgical intervention in the form of joint preservation surgery, but hopefully he will not require at this.        Derek Dudley has a reminder for a due or due soon health maintenance. I have asked that he contact his primary care provider for follow-up on this health maintenance.

## 2023-12-31 NOTE — Progress Notes (Signed)
 Identified pt with two pt identifiers (name and DOB). Reviewed chart in preparation for visit and have obtained necessary documentation.    Derek Dudley is a 23 y.o. male Knee Pain (Right Knee pain )  .    There were no vitals filed for this visit.       1. Have you been to the ER, urgent care clinic since your last visit?  Hospitalized since your last visit?  no     2. Have you seen or consulted any other health care providers outside of the [facility] since your last visit?  Include any pap smears or colon screening.  no

## 2024-02-01 ENCOUNTER — Inpatient Hospital Stay: Admit: 2024-02-01 | Payer: Medicaid (Managed Care) | Primary: Family Medicine

## 2024-02-01 DIAGNOSIS — M25561 Pain in right knee: Principal | ICD-10-CM

## 2024-02-01 NOTE — Therapy Evaluation (Signed)
 "    Physical Therapy at [facility],   a part of 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   8-22 min = 1 unit; 23-37 min = 2 units; 38-52 min = 3 units;  53-67 min = 4 units; 68-82 min = 5 units           SUBJECTIVE  If an interpreting service was utilized for treatment of this patient, the contents of this document represent the material reviewed with the patient via the interpreter.     Pain Level (0-10 scale): 0-8  [] constant [] intermittent [] improving [] worsening [] no change since onset    Any medication changes, allergies to medications, adverse drug reactions, diagnosis change, or new procedure performed?: [x]  No    []  Yes (see summary sheet for update)  Medications: Verified on Patient Summary List    Subjective functional status/changes:     Patient reports he had another player run into his knee laterally and he heard a pop 4 months ago.  He initially experienced stiffness and swelling, but was able to return to playing.  He then had to stop because it swelled back up on him and he was having difficulty with work.  He reports using stairs and having to push off his knee  with return to exercise being painful.  He has returned to most leg exercises at the gym, but has been unable to squat or perform any quad specific exercises.    MRI IMPRESSION (12/22/23):     1. Intact ACL graft.  2. Area of high-grade cartilage loss in the posterior weightbearing lateral  femoral condyle with underlying subchondral cyst and mild edema.  3. Small free edge tear in the junction of the anterior horn and body of the  lateral meniscus.  4. Full-thickness fissuring lateral patellar facet.  5. Mild edema in the superolateral Hoffa's fat pad typically associated with a  lateral patellar tracking abnormality.    Start of Care: 02/01/2024  Onset Date: 09/20/2023  Current symptoms/Complaints: R medial knee pain  Mechanism of Injury: playing basketball  PLOF: Independent with all chores and work activities  Limitations to PLOF/Activity or Recreational Limitations: pain at work, unable to return to exercise or sport  Work Hx: 20 hours/week in [employer] Situation: lives with parents  Mobility: Independent  Self Care: Independent  Previous  Treatment/Compliance: previous PT at this facility for hip and shoulder pain  PMHx/Surgical Hx/Comorbidites:   Past Medical History:  No date: ADHD (attention deficit hyperactivity disorder)  No date: Chronic back pain  R ACL repair 2015  L hip impingement  R shoulder impingement  Current Outpatient Medications on File Prior to Encounter   Medication Sig Dispense Refill    diclofenac  (VOLTAREN ) 75 MG EC tablet Take 1 tablet by mouth 2 times daily as needed for Pain 90 tablet 0     No current facility-administered medications on file prior to encounter.       Prior Hospitalization:  none reported since R ACL surgery 2015  Barriers: [] pain [] Financial [] time [] transportation [] Other:  Substance use: [] Alcohol [] Tobacco [] other:   Pt Goals: pain free return to work and exercise  Motivation: High  Cognition: A & O x 4             OBJECTIVE    Posture:  L knee hyperextension  greater than R knee  Gait and Functional Mobility:  WNL  Palpation: TTP at medial R patella and posterolateral corner  Joint Mobility: R patellar glides WNL and tender    Lumbar AROM:  WNL    ROM:         R   L  Knee Flexion   135 P!   140  Knee Extension  -2   -5      LOWER QUARTER   MUSCLE STRENGTH  KEY       R  L  0 - No Contraction  L1, L2 Psoas  5  5  1  - Trace   L3 Quads  4  5  2  - Poor   L4 Tib Ant  5  5  3  - Fair    L5 EHL  5  5  4  - Good   S1 FHL  5  5  5  - Normal   S2 Hams  4+  5          MMT:    R   L   Hip EXT  4/5   4+/5   Hip ABD    4/5   4+/5   Hip ER   4+/5   5/5   SL Calf Raise  21 reps  25 reps     Neurological:     R  L      SL Balance  30 sec  30 sec    Special Tests:    Lateral tap down: L (6/6)  R (4/6) P!   H.S. SLR: -     Piriformis Ext: -   FADDIR: -     McMurray's: -   SL Squat: 5 reps, painful on R   Knee Varus/Valgus Stress: -      Objective/Functional Outcome Measure: FOTO Score: 49  FOTO score = an established functional score where 100 = no disability      50 min [x] Eval - untimed                        [x]   Patient Education billed concurrently with other procedures   [x]  Review HEP    []  Progressed/Changed HEP, detail:    []  Other detail:         Pain Level at end of session (0-10 scale): 1    Plan of Care / Statement of Necessity for Physical Therapy Services     Assessment / key information:  [name] is referred to skilled therapy services for treatment of R knee pain. Patient is presenting with impaired AROM, strength, stability, and endurance.  The patient demonstrates limitations with ADL that include stairs, work, sit to stand, and safe return to exercise.  Their current condition is complicated by the presence of history of R ACL repair in 2015.  The patient will benefit from skilled PT treatment to improve limitations with ADL and reduce pain with return to PLOF.       Evaluation Complexity:  History:  MEDIUM  Complexity : 1-2 comorbidities / personal factors will impact  the outcome/ POC ; Examination:  MEDIUM Complexity : 3 Standardized tests and measures addressin body structure, function, activity limitation and / or participation in recreation  ;Presentation:  MEDIUM Complexity : Evolving with changing characteristics  ;Clinical Decision Making:  MEDIUM Complexity : FOTO score of 26-74 Overall Complexity Rating: MEDIUM  Problem List: pain affecting function, decrease ROM, decrease strength, decrease ADL/functional abilities, decrease activity tolerance, decrease flexibility/joint mobility, and decrease transfer abilities    Treatment Plan may include any combination of the following: 02889 Therapeutic Exercise, 97112 Neuromuscular Re-Education, 97140 Manual Therapy, 97530 Therapeutic Activity, 97535 Self Care/Home Management, 97014 Electrical Stim unattended / [id number] [facility]), and 97016 Vasopneumatic Device  (Vasopnuematic compression justification:  Per bilateral girth measures taken and listed above the edema is considered significant and having an impact on the patient's strength, balance, gait, transfers, self care, and ADL's)  Patient / Family readiness to learn indicated by: asking questions, trying to perform skills, interest, return verbalization , and return demonstration   Persons(s) to be included in education: patient (P)  Barriers to Learning/Limitations: none  Measures taken if barriers to learning present: none  Patient Self Reported Health Status: good  Rehabilitation Potential: good    Short Term Goals: To be accomplished in 4 treatments.  1) The patient will be independent with introductory HEP with no v.c.   2) The patient will demonstrate knee flexion AROM to 140 deg to allow for donning and doffing of all LE clothing and shoe wear without assistance needed.  3) The patient will demonstrate pain free knee extension A/PROM to improve gait mechanics when ambulating a minimum of 250 ft.  4) The patient will transfer sit to stand without UE assistance to improve  functional mobility in home setting.    Long Term Goals: To be accomplished in 8 treatments.  1) The patient will demonstrate 5/5 R LE strength to allow for ambulation up and down a minimum of 4 standard stairs.  2) The patient will subjectively report the ability to ambulate on uneven surfaces without fear of falling x 500 ft with improved confidence in knee stability.  3) The patient will demonstrate independence with finalized HEP without v.c. needed to allow for all transfers, ambulation x 500 ft and all in home functional mobility.  4) The patient will negotiate 12 steps reciprocally without an increase in pain from baseline level or without reported fear of falling and without UE assistance on railing to improve safety in the home.       Frequency / Duration: Patient to be seen 1 times per week for 8 treatments.    Patient/ Caregiver education and instruction: Diagnosis, prognosis, self care, activity modification, and exercises   [x]   Plan of care has been reviewed with PTA      Certification Period: 02/01/2024 - 04/01/24        Prentice Sous, PT  , DPT,  Cert. DN, CRGA Level II       02/01/2024       10:48 AM        ===================================================================  I certify that the above Therapy Services are being furnished while the patient is under my care. I agree with the treatment plan and certify that this therapy is necessary.    Physician's Signature:_________________________   DATE:_________   TIME:________                           Derek Lang HERO, DO    ** Signature, Date and Time must be completed for valid certification **  Please sign and fax to [phone].  Thank you      "

## 2024-02-09 ENCOUNTER — Encounter: Payer: Medicaid (Managed Care) | Primary: Family Medicine

## 2024-02-18 ENCOUNTER — Inpatient Hospital Stay: Admit: 2024-02-18 | Payer: Medicaid (Managed Care) | Primary: Family Medicine

## 2024-02-18 NOTE — Progress Notes (Signed)
 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   8-22 min = 1 unit; 23-37 min = 2 units; 38-52 min = 3 units; 53-67 min = 4 units; 68-82 min = 5 units            SUBJECTIVE  If an interpreting service was utilized for treatment of this patient, the contents of this document represent the material reviewed with the patient via the interpreter.     Pain Level (0-10 scale): 0    Any medication changes, allergies to medications, adverse drug reactions, diagnosis change, or new procedure performed?: [x]  No    []  Yes (see summary sheet for update)  Medications: Verified on Patient Summary List    Subjective functional status/changes:     Pt reporting he feels like he at 99.8% with his R leg at this time. Pt reporting intermittent swelling following his leg gym routines, but states this has been happening ever since his ACL surgery in 2015. Swelling last approximately 2 days.     OBJECTIVE      Therapeutic Procedures:  Tx Min Billable or 1:1 Min (if diff from Tx Min) Procedure, Rationale, Specifics   10  97110 Therapeutic Exercise (timed):  increase ROM, strength, coordination, balance, and proprioception to improve patient's ability to progress to PLOF and address remaining functional goals. (see flow sheet as applicable)     Details if applicable:     20  97530  Therapeutic Activity (timed):  use of dynamic activities replicating functional movements to increase ROM, strength, coordination, balance, and proprioception in order to improve patient's ability to progress to PLOF and address remaining functional goals.  (see flow sheet as applicable)     Details if applicable:     10  97112 Neuromuscular Re-Education (timed):  improve balance, coordination, kinesthetic sense, posture, core stability and proprioception to improve patient's ability to develop conscious control of individual muscles and awareness of position of extremities in order to progress to PLOF and address remaining functional goals. (see flow sheet as applicable)    Details if applicable:           Details if applicable:            Details if applicable:     40     Total Total     Modalities Rationale:     decrease edema and decrease inflammation to improve patient's ability to progress to PLOF and address remaining functional goals.  min []  Estim Unattended,             type/location:       []   w/ice    []   w/heat        min []  Estim Attended,             type/location:       []   w/ice   []   w/heat         []   w/US    []   TENS insruct            min []   Mechanical Traction,        type/lbs:        []   pro      []   sup           []   int       []   cont            []   before manual           []   after manual     min []   Ultrasound,         settings/location:      min  unbilled []   Ice     []   Heat            location/position:    10     min [x]   Vasopneumatic Device,      press/temp: mod / 34   pre-treatment girth :    post-treatment girth :    measured at (landmark       location) :   If using vaso (only need to measure limb vaso being performed on)        min []   Other:      Skin assessment post-treatment (if applicable):    [x]   intact    []   redness- no adverse reaction                 [] redness - adverse reaction:          [x]   Patient Education billed concurrently with other procedures   [x]  Review  HEP    []  Progressed/Changed HEP, detail:    []  Other detail:         Other Objective/Functional Measures  No pain reported throughout session  Mod fatigue    Pain Level at end of session (0-10 scale): 0      Assessment   Pt tolerated introduction of plymometric strengthening on today's date with moderate fatigue reported. Provided patient an updated HEP. Pt to trial updated HEP 1x/week until his next visit. Will plan to assess patients response and progress with new HEP next visit. Also encouraged patient to utilize ice modalities following workouts if swelling is not reducing within 2 days following.    Patient will continue to benefit from skilled PT / OT services to modify and progress therapeutic interventions, analyze and address functional mobility deficits, analyze and address ROM deficits, analyze and address strength deficits, analyze and address soft tissue restrictions, analyze and cue for proper movement patterns, analyze and modify for postural abnormalities, and analyze and address imbalance/dizziness to address functional deficits and attain remaining goals.    Progress toward goals / Updated goals:  []   See Progress Note/Recertification    Pt demonstrates independence with his HEP at initial follow-up. Monitor response.      PLAN  Yes  Continue plan of care  Re-Cert Due: 87/87/74   []   Upgrade activities  as tolerated  []   Discharge due to:  []   Other:      Norman Lesches, PTA       02/18/2024       12:01 PM              "

## 2024-02-23 ENCOUNTER — Encounter: Payer: Medicaid (Managed Care) | Primary: Family Medicine

## 2024-02-25 ENCOUNTER — Encounter: Payer: Medicaid (Managed Care) | Attending: Adult Reconstructive Orthopaedic Surgery | Primary: Family Medicine

## 2024-02-25 ENCOUNTER — Inpatient Hospital Stay: Admit: 2024-02-25 | Payer: Medicaid (Managed Care) | Primary: Family Medicine

## 2024-02-25 DIAGNOSIS — M25561 Pain in right knee: Principal | ICD-10-CM

## 2024-02-25 NOTE — Progress Notes (Signed)
 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   8-22 min = 1 unit; 23-37 min = 2 units; 38-52 min = 3 units; 53-67 min = 4 units; 68-82 min = 5 units            SUBJECTIVE    Pain Level (0-10 scale): 0    Any medication changes, allergies to medications, adverse drug reactions, diagnosis change, or new procedure performed?: [x]  No    []  Yes (see summary sheet for update)  Medications: Verified on Patient Summary List    Subjective functional status/changes:     Pt reporting he has not been able to practice his new HEP given to him last visit. Pt reporting after his last visit he did not notice any adverse reactions or new pain/symptoms. Pt noting that this morning he knee was noticeably swollen and he only did his normal LE workout yesterday at the gym.     OBJECTIVE      Therapeutic Procedures:  Tx Min Billable or 1:1 Min (if diff from Tx Min) Procedure, Rationale, Specifics   10  97110 Therapeutic Exercise (timed):  increase ROM, strength, coordination, balance, and proprioception to improve patient's ability to progress to PLOF and address remaining functional goals. (see flow sheet as applicable)     Details if applicable:     20  97530 Therapeutic Activity (timed):  use of dynamic activities replicating functional movements to increase ROM, strength,  coordination, balance, and proprioception in order to improve patient's ability to progress to PLOF and address remaining functional goals.  (see flow sheet as applicable)     Details if applicable:     10  97112 Neuromuscular Re-Education (timed):  improve balance, coordination, kinesthetic sense, posture, core stability and proprioception to improve patient's ability to develop conscious control of individual muscles and awareness of position of extremities in order to progress to PLOF and address remaining functional goals. (see flow sheet as applicable)    Details if applicable:           Details if applicable:            Details if applicable:     40     Total Total     Modalities Rationale:     decrease edema and decrease inflammation to improve patient's ability to progress to PLOF and address remaining functional goals.       min []  Estim Unattended,  type/location:       []   w/ice    []   w/heat        min []  Estim Attended,             type/location:       []   w/ice   []   w/heat         []   w/US    []   TENS insruct            min []   Mechanical Traction,        type/lbs:        []   pro      []   sup           []   int       []   cont            []   before manual           []   after manual     min []   Ultrasound,         settings/location:      min  unbilled []   Ice     []   Heat            location/position:    10     min [x]   Vasopneumatic Device,      press/temp: mod / 34   pre-treatment girth :    post-treatment girth :    measured at (landmark       location) :   If using vaso (only need to measure limb vaso being performed on)        min []   Other:      Skin assessment post-treatment (if applicable):    [x]   intact    []   redness- no adverse reaction                 [] redness - adverse reaction:          [x]   Patient Education billed concurrently with other procedures   [x]  Review HEP    []  Progressed/Changed HEP, detail:    []  Other detail:         Other Objective/Functional Measures  No pain  reported throughout session    No significant instability noted with deceleration    Max difficulty with EC on Foam SLS    Pain Level at end of session (0-10 scale): 0      Assessment   Pt tolerated continued volume of plymometric strengthening on today's date with moderate fatigue reported. Pt is showing great stability throughout the R LE at this time but demonstrated poor proprioception and SL stability on uneven surface.  Pt will continue to do well with steady progressions of current therex program in order to return to recreational sports without limitation or fear.   Patient will continue to benefit from skilled PT / OT services to modify and progress therapeutic interventions, analyze and address functional mobility deficits, analyze and address ROM deficits, analyze and address strength deficits, analyze and address soft tissue restrictions, analyze and cue for proper movement patterns, analyze and modify for postural abnormalities, and analyze and address imbalance/dizziness to address functional deficits and attain remaining goals.    Progress toward goals / Updated goals:  []   See Progress Note/Recertification    Short Term Goals: To be accomplished in 4 treatments.  1) The patient will be independent with introductory HEP with no v.c.   2) The patient will demonstrate knee flexion AROM to  140 deg to allow for donning and doffing of all LE clothing and shoe wear without assistance needed.  3) The patient will demonstrate pain free knee extension A/PROM to improve gait mechanics when ambulating a minimum of 250 ft.  4) The patient will transfer sit to stand without UE assistance to improve functional mobility in home setting.     Long Term Goals: To be accomplished in 8 treatments.  1) The patient will demonstrate 5/5 R LE strength to allow for ambulation up and down a minimum of 4 standard stairs.  2) The patient will subjectively report the ability to ambulate on uneven surfaces without fear of falling  x 500 ft with improved confidence in knee stability.  3) The patient will demonstrate independence with finalized HEP without v.c. needed to allow for all transfers, ambulation x 500 ft and all in home functional mobility.  4) The patient will negotiate 12 steps reciprocally without an increase in pain from baseline level or without reported fear of falling and without UE assistance on railing to improve safety in the home.     PLAN  Yes  Continue plan of care  Re-Cert Due: 87/87/74   []   Upgrade activities as tolerated  []   Discharge due to:  []   Other:      Norman Lesches, PTA       02/25/2024       3:14 PM              "

## 2024-03-11 ENCOUNTER — Encounter: Payer: Medicaid (Managed Care) | Primary: Family Medicine

## 2024-03-15 ENCOUNTER — Inpatient Hospital Stay: Admit: 2024-03-15 | Payer: Medicaid (Managed Care) | Primary: Family Medicine

## 2024-03-15 NOTE — Therapy Discharge (Signed)
 Physical Therapy at [facility],   a part of [facility]  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 During today's reassessment, [name] demonstrates great overall improvements with his R knee AROM, B LE strength, functional mobility, and SL balance as compared to his initial evaluation. The pt has met 4/4 STGs and 4/4 LTGs at this time. Pt is to be discharged to his HEP at this time and is welcome to return at anytime if he experiences any functional regression or return of his pain or symptoms.     CURRENT STATUS  Short Term Goals: To be accomplished in 4 treatments.  1) The patient will be independent with introductory HEP with no v.c.  - MET  2) The patient will demonstrate knee flexion AROM to 140 deg to allow for donning and doffing of all LE clothing and shoe wear without assistance needed. - MET  3) The patient will demonstrate pain free knee extension A/PROM to improve gait mechanics when ambulating a minimum of 250 ft. - MET  4) The patient will transfer sit to stand without UE assistance to improve functional mobility in home setting. - MET     Long Term Goals: To be accomplished in 8 treatments.  1) The patient will demonstrate 5/5 R LE strength to allow for ambulation up and down a minimum of 4 standard stairs. - MET  2) The patient will subjectively report the ability to ambulate on uneven surfaces  without fear of falling x 500 ft with improved confidence in knee stability. - MET  3) The patient will demonstrate independence with finalized HEP without v.c. needed to allow for all transfers, ambulation x 500 ft and all in home functional mobility. - MET  4) The patient will negotiate 12 steps reciprocally without an increase in pain from baseline level or without reported fear of falling and without UE assistance on railing to improve safety in the home. - MET      RECOMMENDATIONS  Discontinue therapy. Progressing towards or have reached established goals.        Prentice Sous, PT , DPT, Cert. DN, CRGA Level II        04/28/2024       10:01 AM    If you have any questions/comments please contact us  directly at [phone].   Thank you for allowing us  to assist in the care of your patient.

## 2024-03-15 NOTE — Progress Notes (Addendum)
 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   8-22 min = 1 unit; 23-37 min = 2 units; 38-52 min = 3 units; 53-67 min = 4 units; 68-82 min = 5 units            SUBJECTIVE    Pain Level (0-10 scale): 0    Any medication changes, allergies to medications, adverse drug reactions, diagnosis change, or new procedure performed?: [x]  No    []  Yes (see summary sheet for update)  Medications: Verified on Patient Summary List    Subjective functional status/changes:     Pt reporting I feel like I am at 99.8%, and have the confidence to return to sports     OBJECTIVE      Therapeutic Procedures:  Tx Min Billable or 1:1 Min (if diff from Tx Min) Procedure, Rationale, Specifics     97110 Therapeutic Exercise (timed):  increase ROM, strength, coordination, balance, and proprioception to improve patient's ability to progress to PLOF and address remaining functional goals. (see flow sheet as applicable)     Details if applicable:     15  97535 Self Care/Home Management (timed):  improve patient knowledge and understanding of home injury/symptom/pain management, positioning, posture/ergonomics, home safety, and activity modification  to improve patient's ability to progress to PLOF and address remaining functional goals.  (see flow sheet as applicable)     Details if  applicable:     15  97112 Neuromuscular Re-Education (timed):  improve balance, coordination, kinesthetic sense, posture, core stability and proprioception to improve patient's ability to develop conscious control of individual muscles and awareness of position of extremities in order to progress to PLOF and address remaining functional goals. (see flow sheet as applicable)    Details if applicable:           Details if applicable:            Details if applicable:     30     Total Total     Modalities Rationale:     decrease edema and decrease inflammation to improve patient's ability to progress to PLOF and address remaining functional goals.       min []  Estim Unattended,             type/location:       []   w/ice    []   w/heat        min []  Estim Attended,  type/location:       []   w/ice   []   w/heat         []   w/US    []   TENS insruct            min []   Mechanical Traction,        type/lbs:        []   pro      []   sup           []   int       []   cont            []   before manual           []   after manual     min []   Ultrasound,         settings/location:      min  unbilled []   Ice     []   Heat            location/position:    0     min [x]   Vasopneumatic Device,      press/temp: mod / 34   pre-treatment girth :    post-treatment girth :    measured at (landmark       location) :   If using vaso (only need to measure limb vaso being performed on)        min []   Other:      Skin assessment post-treatment (if applicable):    [x]   intact    []   redness- no adverse reaction                 [] redness - adverse reaction:          [x]   Patient Education billed concurrently with other procedures   [x]  Review HEP    []  Progressed/Changed HEP, detail:    []  Other detail:         Other Objective/Functional Measures    ROM:                                                                             R                                  L  Knee Flexion                           140                               140  Knee Extension                       -2                                 -5        LOWER QUARTER  MUSCLE STRENGTH  KEY                                                                             R                      L  0 - No Contraction                   L1, L2 Psoas               5                      5  1  - Trace                                  L3 Quads                    5                      5  2  - Poor                                   L4 Tib Ant                    5                      5  3  - Fair                                     L5 EHL                        5                      5  4  - Good                                  S1 FHL                        5                      5  5  - Normal                               S2 Hams                     5                       5  MMT:                                       R                                  L              Hip EXT                       5/5                               5/5              Hip ABD                      5/5                                5/5              Hip ER                         5/5                                5/5              SL Calf Raise              25 reps                        25 reps                Neurological:                                      R                      L                                    SL Balance                  30 sec              30 sec     Special Tests:               Lateral tap down: L (6/6)  R (6/6)              H.S. SLR: -                                                      Piriformis Ext: -  FADDIR: -                                                       McMurray's: -              SL Squat: 5 reps, no pain              Knee Varus/Valgus Stress: -         Objective/Functional Outcome Measure: FOTO Score: 91  FOTO score = an established functional score where 100 = no disability       Pain  Level at end of session (0-10 scale): 0      Assessment   Pt has completed 4 skilled OP PT appointments addressing R knee pain with focus on therapeutic exercise, neuromuscular re-education, therapeutic activity, instruction of HEP and self-care tasks, and modalities for pain and swelling control. During today's reassessment, Mahki demonstrates great overall improvements with his R knee AROM, B LE strength, functional mobility, and SL balance as compared to his initial evaluation. The pt has met 4/4 STGs and 4/4 LTGs at this time. Pt is to be discharged to his HEP at this time and is welcome to return at anytime if he experiences any functional regression or return of his pain or symptoms.     Patient will continue to benefit from skilled PT / OT services to modify and progress therapeutic interventions, analyze and address functional mobility deficits, analyze and address ROM deficits, analyze and address strength deficits, analyze and address soft tissue restrictions, analyze and cue for proper movement patterns, analyze and modify for postural abnormalities, and analyze and address imbalance/dizziness to address functional deficits and attain remaining goals.    Progress toward goals / Updated goals:  []   See Progress Note/Recertification    Short Term Goals: To be accomplished in 4 treatments.  1) The patient will be independent with introductory HEP with no v.c.  - MET  2) The patient will demonstrate knee flexion AROM to 140 deg to allow for donning and doffing of all LE clothing and shoe wear without assistance needed. - MET  3) The patient will demonstrate pain free knee extension A/PROM to improve gait mechanics when ambulating a minimum of 250 ft. - MET  4) The patient will transfer sit to stand without UE assistance to improve functional mobility in home setting. - MET     Long Term Goals: To be accomplished in 8 treatments.  1) The patient will demonstrate 5/5 R LE strength to allow for ambulation up  and down a minimum of 4 standard stairs. - MET  2) The patient will subjectively report the ability to ambulate on uneven surfaces without fear of falling x 500 ft with improved confidence in knee stability. - MET  3) The patient will demonstrate independence with finalized HEP without v.c. needed to allow for all transfers, ambulation x 500 ft and all in home functional mobility. - MET  4) The patient will negotiate 12 steps reciprocally without an increase in pain from baseline level or without reported fear of falling and without UE assistance on railing to improve safety in the home. - MET    PLAN  Yes  Continue plan of care  Re-Cert Due: 87/87/74   []   Upgrade activities as tolerated  [x]   Discharge due to:  all goals met  []   Other:      Norman Lesches, PTA       03/15/2024       2:38 PM              "
